# Patient Record
Sex: Male | Born: 2000 | Race: Black or African American | Hispanic: No | Marital: Single | Smoking: Never smoker
Health system: Southern US, Community
[De-identification: ages and names within clinical notes are randomized; demographics above are authoritative.]

## PROBLEM LIST (undated history)

## (undated) HISTORY — PX: APPENDECTOMY: SHX54

---
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
## 2018-06-18 ENCOUNTER — Emergency Department (HOSPITAL_COMMUNITY): Payer: BLUE CROSS/BLUE SHIELD

## 2018-06-18 ENCOUNTER — Emergency Department (HOSPITAL_COMMUNITY): Payer: BLUE CROSS/BLUE SHIELD | Admitting: Certified Registered Nurse Anesthetist

## 2018-06-18 ENCOUNTER — Encounter (HOSPITAL_COMMUNITY)
Admission: EM | Disposition: A | Discharge status: Home / Self Care | Payer: Self-pay | Source: Home / Self Care | Attending: Emergency Medicine

## 2018-06-18 ENCOUNTER — Observation Stay (HOSPITAL_COMMUNITY)
Admission: EM | Admit: 2018-06-18 | Discharge: 2018-06-19 | Disposition: A | Discharge status: Home / Self Care | Payer: BLUE CROSS/BLUE SHIELD | Attending: Surgery | Admitting: Surgery

## 2018-06-18 ENCOUNTER — Encounter (HOSPITAL_COMMUNITY): Payer: Self-pay | Admitting: Emergency Medicine

## 2018-06-18 DIAGNOSIS — K3580 Unspecified acute appendicitis: Secondary | ICD-10-CM | POA: Diagnosis present

## 2018-06-18 HISTORY — PX: LAPAROSCOPIC APPENDECTOMY: SHX408

## 2018-06-18 LAB — COMPREHENSIVE METABOLIC PANEL
ALT: 16 U/L (ref 0–44)
AST: 30 U/L (ref 15–41)
Albumin: 4 g/dL (ref 3.5–5.0)
Alkaline Phosphatase: 56 U/L (ref 38–126)
Anion gap: 8 (ref 5–15)
BUN: 17 mg/dL (ref 6–20)
CO2: 31 mmol/L (ref 22–32)
Calcium: 9.5 mg/dL (ref 8.9–10.3)
Chloride: 100 mmol/L (ref 98–111)
Creatinine, Ser: 1.06 mg/dL (ref 0.61–1.24)
GFR calc Af Amer: >60 mL/min (ref >60)
GFR calc non Af Amer: >60 mL/min (ref >60)
Glucose, Bld: 63 mg/dL — ABNORMAL LOW (ref 70–99)
Potassium: 4.3 mmol/L (ref 3.5–5.1)
Sodium: 139 mmol/L (ref 135–145)
Total Bilirubin: 1.1 mg/dL (ref 0.3–1.2)
Total Protein: 7.5 g/dL (ref 6.5–8.1)

## 2018-06-18 LAB — URINALYSIS, ROUTINE W REFLEX MICROSCOPIC
Bilirubin Urine: NEGATIVE
Glucose, UA: NEGATIVE mg/dL
Hgb urine dipstick: NEGATIVE
Ketones, ur: NEGATIVE mg/dL
Leukocytes, UA: NEGATIVE
Nitrite: NEGATIVE
Protein, ur: NEGATIVE mg/dL
Specific Gravity, Urine: 1.03 (ref 1.005–1.030)
pH: 6 (ref 5.0–8.0)

## 2018-06-18 LAB — CBC
HCT: 45 % (ref 39.0–52.0)
Hemoglobin: 14.9 g/dL (ref 13.0–17.0)
MCH: 28.3 pg (ref 26.0–34.0)
MCHC: 33.1 g/dL (ref 30.0–36.0)
MCV: 85.4 fL (ref 78.0–100.0)
Platelets: 179 10*3/uL (ref 150–400)
RBC: 5.27 MIL/uL (ref 4.22–5.81)
RDW: 11.6 % (ref 11.5–15.5)
WBC: 6.8 10*3/uL (ref 4.0–10.5)

## 2018-06-18 LAB — LIPASE, BLOOD: Lipase: 34 U/L (ref 11–51)

## 2018-06-18 SURGERY — APPENDECTOMY, LAPAROSCOPIC
Anesthesia: General | Site: Abdomen

## 2018-06-18 MED ORDER — BUPIVACAINE-EPINEPHRINE 0.25% -1:200000 IJ SOLN
INTRAMUSCULAR | Status: DC | PRN
  Administered 2018-06-18: 7 mL

## 2018-06-18 MED ORDER — SODIUM CHLORIDE 0.9 % IV SOLN
2.0000 g | Freq: Once | INTRAVENOUS | Status: AC
  Administered 2018-06-18: 2 g via INTRAVENOUS
  Filled 2018-06-18: qty 20

## 2018-06-18 MED ORDER — ACETAMINOPHEN 650 MG RE SUPP: 650.0000 mg | Freq: Four times a day (QID) | RECTAL | Status: DC | PRN

## 2018-06-18 MED ORDER — DIPHENHYDRAMINE HCL 25 MG PO CAPS: 25.0000 mg | ORAL_CAPSULE | Freq: Four times a day (QID) | ORAL | Status: DC | PRN

## 2018-06-18 MED ORDER — LACTATED RINGERS IV SOLN
INTRAVENOUS | Status: DC | PRN
  Administered 2018-06-18: 17:00:00 via INTRAVENOUS

## 2018-06-18 MED ORDER — HYDROMORPHONE HCL 1 MG/ML IJ SOLN: 0.2500 mg | INTRAMUSCULAR | Status: DC | PRN

## 2018-06-18 MED ORDER — MORPHINE SULFATE (PF) 4 MG/ML IV SOLN
4.0000 mg | Freq: Once | INTRAVENOUS | Status: AC
  Administered 2018-06-18: 4 mg via INTRAVENOUS
  Filled 2018-06-18: qty 1

## 2018-06-18 MED ORDER — PROPOFOL 10 MG/ML IV BOLUS
INTRAVENOUS | Status: AC
  Filled 2018-06-18: qty 20

## 2018-06-18 MED ORDER — ONDANSETRON HCL 4 MG/2ML IJ SOLN: 4.0000 mg | Freq: Four times a day (QID) | INTRAMUSCULAR | Status: DC | PRN

## 2018-06-18 MED ORDER — IOHEXOL 300 MG/ML  SOLN
100.0000 mL | Freq: Once | INTRAMUSCULAR | Status: AC | PRN
  Administered 2018-06-18: 100 mL via INTRAVENOUS

## 2018-06-18 MED ORDER — KETOROLAC TROMETHAMINE 30 MG/ML IJ SOLN
INTRAMUSCULAR | Status: DC | PRN
  Administered 2018-06-18: 30 mg via INTRAVENOUS

## 2018-06-18 MED ORDER — PROPOFOL 10 MG/ML IV BOLUS
INTRAVENOUS | Status: DC | PRN
  Administered 2018-06-18: 160 mg via INTRAVENOUS

## 2018-06-18 MED ORDER — OXYCODONE HCL 5 MG PO TABS
5.0000 mg | ORAL_TABLET | ORAL | Status: DC | PRN
  Administered 2018-06-18 – 2018-06-19 (×2): 10 mg via ORAL
  Filled 2018-06-18 (×2): qty 2

## 2018-06-18 MED ORDER — 0.9 % SODIUM CHLORIDE (POUR BTL) OPTIME
TOPICAL | Status: DC | PRN
  Administered 2018-06-18: 1000 mL

## 2018-06-18 MED ORDER — SODIUM CHLORIDE 0.9 % IV BOLUS
1000.0000 mL | Freq: Once | INTRAVENOUS | Status: AC
  Administered 2018-06-18: 1000 mL via INTRAVENOUS

## 2018-06-18 MED ORDER — SUGAMMADEX SODIUM 200 MG/2ML IV SOLN
INTRAVENOUS | Status: DC | PRN
  Administered 2018-06-18: 200 mg via INTRAVENOUS

## 2018-06-18 MED ORDER — MORPHINE SULFATE (PF) 2 MG/ML IV SOLN: 2.0000 mg | INTRAVENOUS | Status: DC | PRN

## 2018-06-18 MED ORDER — LIDOCAINE 2% (20 MG/ML) 5 ML SYRINGE
INTRAMUSCULAR | Status: DC | PRN
  Administered 2018-06-18: 100 mg via INTRAVENOUS

## 2018-06-18 MED ORDER — SODIUM CHLORIDE 0.9 % IV BOLUS
1000.0000 mL | Freq: Once | INTRAVENOUS | Status: AC
Start: 2018-06-18 — End: 2018-06-18
  Administered 2018-06-18: 1000 mL via INTRAVENOUS

## 2018-06-18 MED ORDER — ROCURONIUM BROMIDE 10 MG/ML (PF) SYRINGE
PREFILLED_SYRINGE | INTRAVENOUS | Status: DC | PRN
  Administered 2018-06-18: 30 mg via INTRAVENOUS

## 2018-06-18 MED ORDER — FENTANYL CITRATE (PF) 250 MCG/5ML IJ SOLN
INTRAMUSCULAR | Status: AC
  Filled 2018-06-18: qty 5

## 2018-06-18 MED ORDER — ONDANSETRON HCL 4 MG/2ML IJ SOLN: 4.0000 mg | Freq: Once | INTRAMUSCULAR | Status: DC | PRN

## 2018-06-18 MED ORDER — BUPIVACAINE-EPINEPHRINE (PF) 0.25% -1:200000 IJ SOLN
INTRAMUSCULAR | Status: AC
Start: 2018-06-18 — End: ?
  Filled 2018-06-18: qty 30

## 2018-06-18 MED ORDER — METRONIDAZOLE IN NACL 5-0.79 MG/ML-% IV SOLN
500.0000 mg | Freq: Once | INTRAVENOUS | Status: AC
  Administered 2018-06-18: 500 mg via INTRAVENOUS
  Filled 2018-06-18: qty 100

## 2018-06-18 MED ORDER — MIDAZOLAM HCL 2 MG/2ML IJ SOLN
INTRAMUSCULAR | Status: DC | PRN
  Administered 2018-06-18: 2 mg via INTRAVENOUS

## 2018-06-18 MED ORDER — DIPHENHYDRAMINE HCL 50 MG/ML IJ SOLN: 25.0000 mg | Freq: Four times a day (QID) | INTRAMUSCULAR | Status: DC | PRN

## 2018-06-18 MED ORDER — TRAMADOL HCL 50 MG PO TABS
50.0000 mg | ORAL_TABLET | Freq: Four times a day (QID) | ORAL | Status: DC | PRN
Start: 2018-06-18 — End: 2018-06-19
  Administered 2018-06-19: 50 mg via ORAL
  Filled 2018-06-18: qty 1

## 2018-06-18 MED ORDER — MIDAZOLAM HCL 2 MG/2ML IJ SOLN
INTRAMUSCULAR | Status: AC
  Filled 2018-06-18: qty 2

## 2018-06-18 MED ORDER — MEPERIDINE HCL 50 MG/ML IJ SOLN: 6.2500 mg | INTRAMUSCULAR | Status: DC | PRN

## 2018-06-18 MED ORDER — DEXAMETHASONE SODIUM PHOSPHATE 10 MG/ML IJ SOLN
INTRAMUSCULAR | Status: DC | PRN
  Administered 2018-06-18: 10 mg via INTRAVENOUS

## 2018-06-18 MED ORDER — FENTANYL CITRATE (PF) 250 MCG/5ML IJ SOLN
INTRAMUSCULAR | Status: DC | PRN
  Administered 2018-06-18 (×4): 50 ug via INTRAVENOUS

## 2018-06-18 MED ORDER — ACETAMINOPHEN 325 MG PO TABS: 650.0000 mg | ORAL_TABLET | Freq: Four times a day (QID) | ORAL | Status: DC | PRN

## 2018-06-18 MED ORDER — SUCCINYLCHOLINE CHLORIDE 200 MG/10ML IV SOSY
PREFILLED_SYRINGE | INTRAVENOUS | Status: DC | PRN
  Administered 2018-06-18: 100 mg via INTRAVENOUS

## 2018-06-18 MED ORDER — ONDANSETRON 4 MG PO TBDP: 4.0000 mg | ORAL_TABLET | Freq: Four times a day (QID) | ORAL | Status: DC | PRN

## 2018-06-18 MED ORDER — SODIUM CHLORIDE 0.9 % IV SOLN
INTRAVENOUS | Status: DC
  Administered 2018-06-18: 22:00:00 via INTRAVENOUS

## 2018-06-18 SURGICAL SUPPLY — 50 items
APL SKNCLS STERI-STRIP NONHPOA (GAUZE/BANDAGES/DRESSINGS) ×1
APPLIER CLIP ROT 10 11.4 M/L (STAPLE)
APR CLP MED LRG 11.4X10 (STAPLE)
BAG SPEC RTRVL LRG 6X4 10 (ENDOMECHANICALS) ×1
BENZOIN TINCTURE PRP APPL 2/3 (GAUZE/BANDAGES/DRESSINGS) ×3 IMPLANT
BLADE CLIPPER SURG (BLADE) IMPLANT
CANISTER SUCT 3000ML PPV (MISCELLANEOUS) IMPLANT
CHLORAPREP W/TINT 26ML (MISCELLANEOUS) ×3 IMPLANT
CLIP APPLIE ROT 10 11.4 M/L (STAPLE) IMPLANT
CLOSURE WOUND 1/2 X4 (GAUZE/BANDAGES/DRESSINGS) ×1
COVER SURGICAL LIGHT HANDLE (MISCELLANEOUS) ×3 IMPLANT
CUTTER ENDO LINEAR 45M (STAPLE) ×2 IMPLANT
CUTTER FLEX LINEAR 45M (STAPLE) ×3 IMPLANT
DRSG TEGADERM 2-3/8X2-3/4 SM (GAUZE/BANDAGES/DRESSINGS) ×4 IMPLANT
DRSG TEGADERM 4X4.75 (GAUZE/BANDAGES/DRESSINGS) ×3 IMPLANT
ELECT REM PT RETURN 9FT ADLT (ELECTROSURGICAL) ×3
ELECTRODE REM PT RTRN 9FT ADLT (ELECTROSURGICAL) ×1 IMPLANT
ENDOLOOP SUT PDS II  0 18 (SUTURE)
ENDOLOOP SUT PDS II 0 18 (SUTURE) IMPLANT
FILTER SMOKE EVAC LAPAROSHD (FILTER) IMPLANT
GAUZE SPONGE 2X2 8PLY NS (GAUZE/BANDAGES/DRESSINGS) ×2 IMPLANT
GAUZE SPONGE 2X2 8PLY STRL LF (GAUZE/BANDAGES/DRESSINGS) ×1 IMPLANT
GLOVE BIO SURGEON STRL SZ7 (GLOVE) ×3 IMPLANT
GLOVE BIOGEL PI IND STRL 7.5 (GLOVE) ×1 IMPLANT
GLOVE BIOGEL PI INDICATOR 7.5 (GLOVE) ×2
GOWN STRL REUS W/ TWL LRG LVL3 (GOWN DISPOSABLE) ×3 IMPLANT
GOWN STRL REUS W/TWL LRG LVL3 (GOWN DISPOSABLE) ×9
KIT BASIN OR (CUSTOM PROCEDURE TRAY) ×3 IMPLANT
KIT TURNOVER KIT B (KITS) ×3 IMPLANT
NS IRRIG 1000ML POUR BTL (IV SOLUTION) ×3 IMPLANT
PAD ARMBOARD 7.5X6 YLW CONV (MISCELLANEOUS) ×6 IMPLANT
POUCH SPECIMEN RETRIEVAL 10MM (ENDOMECHANICALS) ×3 IMPLANT
RELOAD STAPLE 45 3.5 BLU ETS (ENDOMECHANICALS) ×1 IMPLANT
RELOAD STAPLE TA45 3.5 REG BLU (ENDOMECHANICALS) ×3 IMPLANT
SCISSORS ENDO CVD 5DCS (MISCELLANEOUS) IMPLANT
SET IRRIG TUBING LAPAROSCOPIC (IRRIGATION / IRRIGATOR) IMPLANT
SHEARS HARMONIC ACE PLUS 36CM (ENDOMECHANICALS) ×3 IMPLANT
SLEEVE ENDOPATH XCEL 5M (ENDOMECHANICALS) ×3 IMPLANT
SPECIMEN JAR SMALL (MISCELLANEOUS) ×3 IMPLANT
SPONGE GAUZE 2X2 STER 10/PKG (GAUZE/BANDAGES/DRESSINGS) ×2
STRIP CLOSURE SKIN 1/2X4 (GAUZE/BANDAGES/DRESSINGS) ×2 IMPLANT
SUT MNCRL AB 4-0 PS2 18 (SUTURE) ×3 IMPLANT
TAPE STRIPS DRAPE STRL (GAUZE/BANDAGES/DRESSINGS) ×2 IMPLANT
TOWEL OR 17X24 6PK STRL BLUE (TOWEL DISPOSABLE) ×3 IMPLANT
TOWEL OR 17X26 10 PK STRL BLUE (TOWEL DISPOSABLE) ×3 IMPLANT
TRAY LAPAROSCOPIC MC (CUSTOM PROCEDURE TRAY) ×3 IMPLANT
TROCAR XCEL BLUNT TIP 100MML (ENDOMECHANICALS) ×3 IMPLANT
TROCAR XCEL NON-BLD 5MMX100MML (ENDOMECHANICALS) ×3 IMPLANT
TUBING INSUFFLATION (TUBING) ×3 IMPLANT
WATER STERILE IRR 1000ML POUR (IV SOLUTION) ×3 IMPLANT

## 2018-06-18 NOTE — Anesthesia Postprocedure Evaluation (Signed)
Anesthesia Post Note  Patient: Charles Craig  Procedure(s) Performed: APPENDECTOMY LAPAROSCOPIC (N/A Abdomen)     Patient location during evaluation: PACU Anesthesia Type: General Level of consciousness: awake and alert Pain management: pain level controlled Vital Signs Assessment: post-procedure vital signs reviewed and stable Respiratory status: spontaneous breathing, nonlabored ventilation, respiratory function stable and patient connected to nasal cannula oxygen Cardiovascular status: blood pressure returned to baseline and stable Postop Assessment: no apparent nausea or vomiting Anesthetic complications: no    Last Vitals:  Vitals:   06/18/18 1830 06/18/18 1845  BP: 118/66 125/79  Pulse: 86 (!) 58  Resp: 19 14  Temp:    SpO2: 99% 100%    Last Pain:  Vitals:   06/18/18 1900  TempSrc:   PainSc: 0-No pain                 Jarica Plass DAVID

## 2018-06-18 NOTE — Anesthesia Procedure Notes (Signed)
Procedure Name: Intubation Date/Time: 06/18/2018 5:19 PM Performed by: Clearnce Sorrel, CRNA Pre-anesthesia Checklist: Patient identified, Emergency Drugs available, Suction available, Patient being monitored and Timeout performed Patient Re-evaluated:Patient Re-evaluated prior to induction Oxygen Delivery Method: Circle system utilized Preoxygenation: Pre-oxygenation with 100% oxygen Induction Type: IV induction, Cricoid Pressure applied and Rapid sequence Laryngoscope Size: Mac and 3 Grade View: Grade I Tube type: Oral Tube size: 7.5 mm Number of attempts: 1 Airway Equipment and Method: Stylet Placement Confirmation: ETT inserted through vocal cords under direct vision,  positive ETCO2 and breath sounds checked- equal and bilateral Secured at: 22 cm Tube secured with: Tape Dental Injury: Teeth and Oropharynx as per pre-operative assessment

## 2018-06-18 NOTE — Progress Notes (Signed)
Pt tolerating clears, will try full liquid

## 2018-06-18 NOTE — H&P (Signed)
Charles Craig is an 17 y.o. male.   Chief Complaint: RLQ pain HPI: This is an 17 yo male in good health who presents with several days of RLQ abdominal pain, mild anorexia.  No nausea or vomiting.  Pain has improved slightly.  He came to the ED for evaluation and was found to have early appendicitis.  History reviewed. No pertinent past medical history.  History reviewed. No pertinent surgical history.  No family history on file. Social History:  reports that he is a non-smoker but has been exposed to tobacco smoke. He has never used smokeless tobacco. He reports that he has current or past drug history. Drug: Marijuana. He reports that he does not drink alcohol.  Allergies: No Known Allergies  Prior to Admission medications   Medication Sig Start Date End Date Taking? Authorizing Provider  calcium carbonate (OS-CAL) 1250 (500 Ca) MG chewable tablet Chew 1 tablet by mouth daily.   Yes [provider]  ibuprofen (ADVIL,MOTRIN) 200 MG tablet Take 400 mg by mouth every 6 (six) hours as needed for headache or moderate pain.   Yes [provider]  ibuprofen (ADVIL,MOTRIN) 600 MG tablet Take 1 tablet (600 mg total) by mouth every 6 (six) hours as needed for mild pain or moderate pain. Patient not taking: Reported on 06/18/2018 03/11/17   Benjamine Sprague, NP     Results for orders placed or performed during the hospital encounter of 06/18/18 (from the past 48 hour(s))  Lipase, blood     Status: None   Collection Time: 06/18/18 11:46 AM  Result Value Ref Range   Lipase 34 11 - 51 U/L    Comment: Performed at [facility], [place] [address] [place], [place] [address]  Comprehensive metabolic panel     Status: Abnormal   Collection Time: 06/18/18 11:46 AM  Result Value Ref Range   Sodium 139 135 - 145 mmol/L   Potassium 4.3 3.5 - 5.1 mmol/L   Chloride 100 98 - 111 mmol/L   CO2 31 22 - 32 mmol/L   Glucose, Bld 63 (L) 70 - 99 mg/dL   BUN 17 6 - 20 mg/dL    Creatinine, Ser 1.06 0.61 - 1.24 mg/dL   Calcium 9.5 8.9 - 10.3 mg/dL   Total Protein 7.5 6.5 - 8.1 g/dL   Albumin 4.0 3.5 - 5.0 g/dL   AST 30 15 - 41 U/L   ALT 16 0 - 44 U/L   Alkaline Phosphatase 56 38 - 126 U/L   Total Bilirubin 1.1 0.3 - 1.2 mg/dL   GFR calc non Af Amer >60 >60 mL/min   GFR calc Af Amer >60 >60 mL/min    Comment: (NOTE) The eGFR has been calculated using the CKD EPI equation. This calculation has not been validated in all clinical situations. eGFR's persistently <60 mL/min signify possible Chronic Kidney Disease.    Anion gap 8 5 - 15    Comment: Performed at [facility] [address] [place] [address]  CBC     Status: None   Collection Time: 06/18/18 11:46 AM  Result Value Ref Range   WBC 6.8 4.0 - 10.5 K/uL   RBC 5.27 4.22 - 5.81 MIL/uL   Hemoglobin 14.9 13.0 - 17.0 g/dL   HCT 45.0 39.0 - 52.0 %   MCV 85.4 78.0 - 100.0 fL   MCH 28.3 26.0 - 34.0 pg   MCHC 33.1 30.0 - 36.0 g/dL   RDW 11.6  11.5 - 15.5 %   Platelets 179 150 - 400 K/uL    Comment: Performed at [facility], [place] [address], [place], [place] [address]  Urinalysis, Routine w reflex microscopic     Status: None   Collection Time: 06/18/18 11:46 AM  Result Value Ref Range   Color, Urine YELLOW YELLOW   APPearance CLEAR CLEAR   Specific Gravity, Urine 1.030 1.005 - 1.030   pH 6.0 5.0 - 8.0   Glucose, UA NEGATIVE NEGATIVE mg/dL   Hgb urine dipstick NEGATIVE NEGATIVE   Bilirubin Urine NEGATIVE NEGATIVE   Ketones, ur NEGATIVE NEGATIVE mg/dL   Protein, ur NEGATIVE NEGATIVE mg/dL   Nitrite NEGATIVE NEGATIVE   Leukocytes, UA NEGATIVE NEGATIVE    Comment: Performed at [facility] [address], [place], [place] [address]   Ct Abdomen Pelvis W Contrast  Result Date: 06/18/2018 CLINICAL DATA:  Abdominal pain. EXAM: CT ABDOMEN AND PELVIS WITH CONTRAST TECHNIQUE: Multidetector CT imaging of the abdomen and pelvis was performed using the standard protocol following  bolus administration of intravenous contrast. CONTRAST:  144m OMNIPAQUE IOHEXOL 300 MG/ML  SOLN COMPARISON:  None. FINDINGS: Lower chest: Clear lung bases.  Heart normal in size. Hepatobiliary: Liver is normal in size and attenuation. There is low attenuation surrounding the portal veins consist with periportal edema, of unclear etiology. No liver mass or focal lesion. No bile duct dilation. Gallbladder is mostly collapsed but otherwise unremarkable. Pancreas: Choose 1 Spleen: Normal in size without focal abnormality. Adrenals/Urinary Tract: No adrenal masses. Kidneys normal size, orientation and position with symmetric enhancement. No renal masses, stones or hydronephrosis. Visualized portions of the ureters are normal course and in caliber. Bladder is unremarkable. Stomach/Bowel: In the right pelvis there is a curled tubular structure measuring 9 mm in greatest diameter containing too small stones. This appears to be blind-ending and is consistent with the appendix. There is no definite associated inflammation, although this assessment is limited by the limited adjacent fat. Stomach is unremarkable. Bowel is normal caliber with no wall thickening inflammation. Vascular/Lymphatic: No significant vascular findings are present. No enlarged abdominal or pelvic lymph nodes. Reproductive: Unremarkable Other: No abdominal wall hernia or abnormality. No abdominopelvic ascites. Musculoskeletal: No acute or significant osseous findings. IMPRESSION: 1. Findings are consistent with early acute appendicitis. The appendix lies in the right pelvis, dilated to a maximum of 9 mm, containing 2 small appendicoliths. There is no apparent adjacent inflammation, however. No evidence of appendiceal rupture or of a periappendiceal abscess. 2. Periportal edema the liver unclear etiology. This can be seen with hepatic inflammation. 3. No other abnormalities. Electronically Signed   By: DLajean ManesM.D.   On: 06/18/2018 15:17     Review of Systems  Constitutional: Negative for weight loss.  HENT: Negative for ear discharge, ear pain, hearing loss and tinnitus.   Eyes: Negative for blurred vision, double vision, photophobia and pain.  Respiratory: Negative for cough, sputum production and shortness of breath.   Cardiovascular: Negative for chest pain.  Gastrointestinal: Positive for abdominal pain. Negative for nausea and vomiting.  Genitourinary: Negative for dysuria, flank pain, frequency and urgency.  Musculoskeletal: Negative for back pain, falls, joint pain, myalgias and neck pain.  Neurological: Negative for dizziness, tingling, sensory change, focal weakness, loss of consciousness and headaches.  Endo/Heme/Allergies: Does not bruise/bleed easily.  Psychiatric/Behavioral: Negative for depression, memory loss and substance abuse. The patient is not nervous/anxious.     Blood pressure (!) 135/95, pulse 84, temperature 98.5  F (36.9 C), temperature source Oral, resp. rate 14, height 5' 7"  (1.702 m), weight 61.2 kg, SpO2 99 %. Physical Exam  WDWN in NAD Eyes:  Pupils equal, round; sclera anicteric HENT:  Oral mucosa moist; good dentition  Neck:  No masses palpated, no thyromegaly Lungs:  CTA bilaterally; normal respiratory effort CV:  Regular rate and rhythm; no murmurs; extremities well-perfused with no edema Abd:  +bowel sounds, soft, tender in Right pelvis; no palpable masses, no palpable organomegaly; no palpable hernias Skin:  Warm, dry; no sign of jaundice Psychiatric - alert and oriented x 4; calm mood and affect  Assessment/Plan Acute appendicitis  Laparoscopic appendectomy.  The surgical procedure has been discussed with the patient.  Potential risks, benefits, alternative treatments, and expected outcomes have been explained.  All of the patient's questions at this time have been answered.  The likelihood of reaching the patient's treatment goal is good.  The patient understand the proposed  surgical procedure and wishes to proceed.   Maia Petties, MD 06/18/2018, 4:20 PM

## 2018-06-18 NOTE — ED Notes (Signed)
Patient transported to CT 

## 2018-06-18 NOTE — Op Note (Signed)
Appendectomy, Lap, Procedure Note  Indications: The patient presented with a history of right-sided abdominal pain. A CT scan revealed findings consistent with acute appendicitis.  Pre-operative Diagnosis: Acute appendicitis without mention of peritonitis  Post-operative Diagnosis: Same  Surgeon: Wynona LunaMatthew K Radames Craig   Assistants: none  Anesthesia: General endotracheal anesthesia  ASA Class: 1E  Procedure Details  The patient was seen again in the Holding Room. The risks, benefits, complications, treatment options, and expected outcomes were discussed with the patient and/or family. The possibilities of reaction to medication, perforation of viscus, bleeding, recurrent infection, finding a normal appendix, the need for additional procedures, failure to diagnose a condition, and creating a complication requiring transfusion or operation were discussed. There was concurrence with the proposed plan and informed consent was obtained. The site of surgery was properly noted. The patient was taken to Operating Room, identified as Charles Craig and the procedure verified as Appendectomy. A Time Out was held and the above information confirmed.  The patient was placed in the supine position and general anesthesia was induced.  The abdomen was prepped and draped in a sterile fashion. A one centimeter supraumbilical incision was made.  Dissection was carried down to the fascia bluntly.  The fascia was incised vertically.  We entered the peritoneal cavity bluntly.  A pursestring suture was passed around the incision with a 0 Vicryl.  The Hasson cannula was introduced into the abdomen and the tails of the suture were used to hold the Hasson in place.   The pneumoperitoneum was then established maintaining a maximum pressure of 15 mmHg.  Additional 5 mm cannulas then placed in the left lower quadrant of the abdomen and the right upper quadrant under direct visualization. A careful evaluation of the entire  abdomen was carried out. The patient was placed in Trendelenburg and left lateral decubitus position.  The scope was moved to the right upper quadrant port site. The cecum was mobilized medially.  The appendix was inflamed but there was no sign of perforation. The appendix was carefully dissected. The appendix was skeletonized with the harmonic scalpel.   The appendix was divided at its base using an endo-GIA stapler. Minimal appendiceal stump was left in place. There was no evidence of bleeding, leakage, or complication after division of the appendix. Irrigation was also performed and irrigate suctioned from the abdomen as well.  The umbilical port site was closed with the purse string suture. There was no residual palpable fascial defect.  The trocar site skin wounds were closed with 4-0 Monocryl.  Instrument, sponge, and needle counts were correct at the conclusion of the case.   Findings: The appendix was found to be inflamed. There were not signs of necrosis.  There was not perforation. There was not abscess formation.  Estimated Blood Loss:  Minimal         Drains: none         Specimens: appendix          Complications:  None; patient tolerated the procedure well.         Disposition: PACU - hemodynamically stable.         Condition: stable  Charles ArmsMatthew K. Corliss Skainssuei, MD, [facility]  General/ Trauma Surgery  06/18/2018 5:58 PM

## 2018-06-18 NOTE — Anesthesia Preprocedure Evaluation (Signed)
Anesthesia Evaluation  Patient identified by MRN, date of birth, ID band Patient awake    Reviewed: Allergy & Precautions, NPO status , Patient's Chart, lab work & pertinent test results  Airway Mallampati: I  TM Distance: >3 FB Neck ROM: Full    Dental   Pulmonary    Pulmonary exam normal        Cardiovascular Normal cardiovascular exam     Neuro/Psych    GI/Hepatic   Endo/Other    Renal/GU      Musculoskeletal   Abdominal   Peds  Hematology   Anesthesia Other Findings   Reproductive/Obstetrics                             Anesthesia Physical Anesthesia Plan  ASA: II and emergent  Anesthesia Plan: General   Post-op Pain Management:    Induction: Intravenous, Rapid sequence and Cricoid pressure planned  PONV Risk Score and Plan: 2 and Ondansetron and Midazolam  Airway Management Planned: Oral ETT  Additional Equipment:   Intra-op Plan:   Post-operative Plan: Extubation in OR  Informed Consent: I have reviewed the patients History and Physical, chart, labs and discussed the procedure including the risks, benefits and alternatives for the proposed anesthesia with the patient or authorized representative who has indicated his/her understanding and acceptance.       Plan Discussed with: CRNA and Surgeon  Anesthesia Plan Comments:         Anesthesia Quick Evaluation  

## 2018-06-18 NOTE — Anesthesia Postprocedure Evaluation (Signed)
Anesthesia Post Note  Patient: Charles Craig  Procedure(s) Performed: APPENDECTOMY LAPAROSCOPIC (N/A )     Patient location during evaluation: SICU Anesthesia Type: General Level of consciousness: sedated Pain management: pain level controlled Vital Signs Assessment: post-procedure vital signs reviewed and stable Respiratory status: patient remains intubated per anesthesia plan Cardiovascular status: stable Postop Assessment: no apparent nausea or vomiting Anesthetic complications: no    Last Vitals:  Vitals:   06/18/18 1245 06/18/18 1538  BP: 119/68 (!) 135/95  Pulse: 66 84  Resp:  14  Temp:    SpO2: 100% 99%    Last Pain:  Vitals:   06/18/18 1540  TempSrc:   PainSc: 7                  Muriah Harsha DAVID

## 2018-06-18 NOTE — ED Provider Notes (Signed)
[facility] EMERGENCY DEPARTMENT Provider Note   CSN: [id number] Arrival date & time: 06/18/18  1139     History   Chief Complaint Chief Complaint  Patient presents with  . Abdominal Pain    HPI Charles Craig is a 17 y.o. male with no significant past medical history who presents for evaluation of intermittent right lower quadrant abdominal pain that began a proximally 4 days ago.  Patient states that when he first had the pain, it was very intense in the right lower quadrant.  He states that it resolved on its own.  He states that since then, the pain has come and gone.  He states that the pain is worse after eating.  He describes it as a sharp pain in the right lower quadrant.  He states that he has not had any vomiting.  He had one episode of diarrhea yesterday.  No blood in the stool.  No diarrhea since then.  Patient states he has not had any fever.  He states he has not taken any medication for the pain.  Currently denies any pain at this time.  Patient denies any fevers, chest pain, difficulty breathing, urinary complaints.  The history is provided by the patient.    History reviewed. No pertinent past medical history.  There are no active problems to display for this patient.   History reviewed. No pertinent surgical history.      Home Medications    Prior to Admission medications   Medication Sig Start Date End Date Taking? Authorizing Provider  calcium carbonate (OS-CAL) 1250 (500 Ca) MG chewable tablet Chew 1 tablet by mouth daily.   Yes [provider]  ibuprofen (ADVIL,MOTRIN) 200 MG tablet Take 400 mg by mouth every 6 (six) hours as needed for headache or moderate pain.   Yes [provider]  ibuprofen (ADVIL,MOTRIN) 600 MG tablet Take 1 tablet (600 mg total) by mouth every 6 (six) hours as needed for mild pain or moderate pain. Patient not taking: Reported on 06/18/2018 03/11/17   Ronnell Freshwater, NP    Family  History No family history on file.  Social History Social History   Tobacco Use  . Smoking status: Passive Smoke Exposure - Never Smoker  . Smokeless tobacco: Never Used  Substance Use Topics  . Alcohol use: No  . Drug use: Yes    Types: Marijuana    Comment: only 3 times     Allergies   Patient has no known allergies.   Review of Systems Review of Systems  Constitutional: Negative for fever.  Respiratory: Negative for cough and shortness of breath.   Cardiovascular: Negative for chest pain.  Gastrointestinal: Positive for abdominal pain and diarrhea. Negative for nausea and vomiting.  Genitourinary: Negative for dysuria and hematuria.  Neurological: Negative for headaches.  All other systems reviewed and are negative.    Physical Exam Updated Vital Signs BP (!) 135/95   Pulse 84   Temp 98.5 F (36.9 C) (Oral)   Resp 14   Ht 5\' 7"  (1.702 m)   Wt 61.2 kg   SpO2 99%   BMI 21.14 kg/m   Physical Exam  Constitutional: He appears well-developed and well-nourished.  HENT:  Head: Normocephalic and atraumatic.  Eyes: Conjunctivae and EOM are normal. Right eye exhibits no discharge. Left eye exhibits no discharge. No scleral icterus.  Pulmonary/Chest: Effort normal.  Abdominal: Bowel sounds are normal. There is tenderness. There is tenderness at McBurney's point. There is  no rigidity, no guarding and no CVA tenderness. Hernia confirmed negative in the right inguinal area and confirmed negative in the left inguinal area.  Abdomen is soft, nondistended.  He does exhibit tenderness at McBurney's point.  No rigidity, guarding.  No CVA tenderness bilaterally.  Genitourinary: Testes normal and penis normal. Right testis shows no swelling and no tenderness. Left testis shows no swelling and no tenderness. Circumcised.  Genitourinary Comments: The exam was performed with a chaperone present. Normal male genitalia. No evidence of rash, ulcers or lesions.  No tenderness, swelling,  erythema noted to bilateral testicles.  No evidence of hernia.  Neurological: He is alert.  Skin: Skin is warm and dry.  Psychiatric: He has a normal mood and affect. His speech is normal and behavior is normal.  Nursing note and vitals reviewed.    ED Treatments / Results  Labs (all labs ordered are listed, but only abnormal results are displayed) Labs Reviewed  COMPREHENSIVE METABOLIC PANEL - Abnormal; Notable for the following components:      Result Value   Glucose, Bld 63 (*)    All other components within normal limits  LIPASE, BLOOD  CBC  URINALYSIS, ROUTINE W REFLEX MICROSCOPIC    EKG None  Radiology Ct Abdomen Pelvis W Contrast  Result Date: 06/18/2018 CLINICAL DATA:  Abdominal pain. EXAM: CT ABDOMEN AND PELVIS WITH CONTRAST TECHNIQUE: Multidetector CT imaging of the abdomen and pelvis was performed using the standard protocol following bolus administration of intravenous contrast. CONTRAST:  100mL OMNIPAQUE IOHEXOL 300 MG/ML  SOLN COMPARISON:  None. FINDINGS: Lower chest: Clear lung bases.  Heart normal in size. Hepatobiliary: Liver is normal in size and attenuation. There is low attenuation surrounding the portal veins consist with periportal edema, of unclear etiology. No liver mass or focal lesion. No bile duct dilation. Gallbladder is mostly collapsed but otherwise unremarkable. Pancreas: Choose 1 Spleen: Normal in size without focal abnormality. Adrenals/Urinary Tract: No adrenal masses. Kidneys normal size, orientation and position with symmetric enhancement. No renal masses, stones or hydronephrosis. Visualized portions of the ureters are normal course and in caliber. Bladder is unremarkable. Stomach/Bowel: In the right pelvis there is a curled tubular structure measuring 9 mm in greatest diameter containing too small stones. This appears to be blind-ending and is consistent with the appendix. There is no definite associated inflammation, although this assessment is  limited by the limited adjacent fat. Stomach is unremarkable. Bowel is normal caliber with no wall thickening inflammation. Vascular/Lymphatic: No significant vascular findings are present. No enlarged abdominal or pelvic lymph nodes. Reproductive: Unremarkable Other: No abdominal wall hernia or abnormality. No abdominopelvic ascites. Musculoskeletal: No acute or significant osseous findings. IMPRESSION: 1. Findings are consistent with early acute appendicitis. The appendix lies in the right pelvis, dilated to a maximum of 9 mm, containing 2 small appendicoliths. There is no apparent adjacent inflammation, however. No evidence of appendiceal rupture or of a periappendiceal abscess. 2. Periportal edema the liver unclear etiology. This can be seen with hepatic inflammation. 3. No other abnormalities. Electronically Signed   By: Amie Portlandavid  Ormond M.D.   On: 06/18/2018 15:17    Procedures Procedures (including critical care time)  Medications Ordered in ED Medications  cefTRIAXone (ROCEPHIN) 2 g in sodium chloride 0.9 % 100 mL IVPB (0 g Intravenous Stopped 06/18/18 1614)    And  metroNIDAZOLE (FLAGYL) IVPB 500 mg (500 mg Intravenous New Bag/Given 06/18/18 1615)  sodium chloride 0.9 % bolus 1,000 mL (1,000 mLs Intravenous New Bag/Given 06/18/18 1547)  sodium chloride 0.9 % bolus 1,000 mL (0 mLs Intravenous Stopped 06/18/18 1529)  iohexol (OMNIPAQUE) 300 MG/ML solution 100 mL (100 mLs Intravenous Contrast Given 06/18/18 1432)  morphine 4 MG/ML injection 4 mg (4 mg Intravenous Given 06/18/18 1544)     Initial Impression / Assessment and Plan / ED Course  I have reviewed the triage vital signs and the nursing notes.  Pertinent labs & imaging results that were available during my care of the patient were reviewed by me and considered in my medical decision making (see chart for details).     17 year old male who presents for evaluation of right lower quadrant abdominal pain that began 4 days ago.  Reports pain  is been intermittent.  Worse after eating.  No fevers, vomiting.  One episode of diarrhea yesterday but none since. Patient is afebrile, non-toxic appearing, sitting comfortably on examination table. Vital signs reviewed and stable.  On exam, she exhibits tenderness noted to the right lower quadrant, specifically at McBurney's point tenderness.  No other tenderness.  No CVA tenderness.  Low suspicion for appendicitis given history/physical exam but given concerns of McBurney's point tenderness, will plan for evaluation with imaging.  Also consider PUD versus viral gastroenteritis.  Labs ordered at triage.    CMP unremarkable.  Lipase is normal.  UA without any acute infectious etiology.  CBC without any leukocytosis, anemia.  CT abdomen pelvis is consistent with early acute appendicitis.  No evidence of rupture, abscess.  Discussed results with since.  We will plan to consult general surgery.  Discussed patient with Dr. Corliss Skains (Gen Surg). Will see patient.   Final Clinical Impressions(s) / ED Diagnoses   Final diagnoses:  Acute appendicitis, unspecified acute appendicitis type    ED Discharge Orders    None       Maxwell Caul, PA-C 06/18/18 1617    Charlynne Pander, MD 06/20/18 [phone]

## 2018-06-18 NOTE — Transfer of Care (Signed)
Immediate Anesthesia Transfer of Care Note  Patient: Charles Craig  Procedure(s) Performed: APPENDECTOMY LAPAROSCOPIC (N/A Abdomen)  Patient Location: PACU  Anesthesia Type:General  Level of Consciousness: awake, alert  and oriented  Airway & Oxygen Therapy: Patient Spontanous Breathing and Patient connected to nasal cannula oxygen  Post-op Assessment: Report given to RN and Post -op Vital signs reviewed and stable  Post vital signs: Reviewed and stable  Last Vitals:  Vitals Value Taken Time  BP    Temp    Pulse 78 06/18/2018  6:09 PM  Resp 16 06/18/2018  6:09 PM  SpO2 100 % 06/18/2018  6:09 PM  Vitals shown include unvalidated device data.  Last Pain:  Vitals:   06/18/18 1540  TempSrc:   PainSc: 7          Complications: No apparent anesthesia complications

## 2018-06-18 NOTE — ED Triage Notes (Signed)
Pt reports RLQ pain since Thursday, patient states pain gets worse when he eats. Denies vomiting, endorses diarrhea.

## 2018-06-19 ENCOUNTER — Encounter (HOSPITAL_COMMUNITY): Payer: Self-pay | Admitting: Surgery

## 2018-06-19 ENCOUNTER — Other Ambulatory Visit: Payer: Self-pay

## 2018-06-19 ENCOUNTER — Encounter (HOSPITAL_COMMUNITY): Payer: Self-pay

## 2018-06-19 DIAGNOSIS — K3580 Unspecified acute appendicitis: Secondary | ICD-10-CM | POA: Diagnosis not present

## 2018-06-19 MED ORDER — OXYCODONE HCL 5 MG PO TABS: 5.0000 mg | ORAL_TABLET | Freq: Four times a day (QID) | ORAL | 0 refills | Status: DC | PRN

## 2018-06-19 NOTE — Discharge Instructions (Signed)
Please arrive at least 30 min before your appointment to complete your check in paperwork.  If you are unable to arrive 30 min prior to your appointment time we may have to cancel or reschedule you. ° °LAPAROSCOPIC SURGERY: POST OP INSTRUCTIONS  °1. DIET: Follow a light bland diet the first 24 hours after arrival home, such as soup, liquids, crackers, etc. Be sure to include lots of fluids daily. Avoid fast food or heavy meals as your are more likely to get nauseated. Eat a low fat the next few days after surgery.  °2. Take your usually prescribed home medications unless otherwise directed. °3. PAIN CONTROL:  °1. Pain is best controlled by a usual combination of three different methods TOGETHER:  °1. Ice/Heat °2. Over the counter pain medication °3. Prescription pain medication °2. Most patients will experience some swelling and bruising around the incisions. Ice packs or heating pads (30-60 minutes up to 6 times a day) will help. Use ice for the first few days to help decrease swelling and bruising, then switch to heat to help relax tight/sore spots and speed recovery. Some people prefer to use ice alone, heat alone, alternating between ice & heat. Experiment to what works for you. Swelling and bruising can take several weeks to resolve.  °3. It is helpful to take an over-the-counter pain medication regularly for the first few weeks. Choose one of the following that works best for you:  °1. Naproxen (Aleve, etc) Two 220mg tabs twice a day °2. Ibuprofen (Advil, etc) Three 200mg tabs four times a day (every meal & bedtime) °3. Acetaminophen (Tylenol, etc) 500-650mg four times a day (every meal & bedtime) °4. A prescription for pain medication (such as oxycodone, hydrocodone, etc) should be given to you upon discharge. Take your pain medication as prescribed.  °1. If you are having problems/concerns with the prescription medicine (does not control pain, nausea, vomiting, rash, itching, etc), please call us [phone] to see if we need to switch you to a different pain medicine that will work better for you and/or control your side effect better. °2. If you need a refill on your pain medication, please contact your pharmacy. They will contact our office to request authorization. Prescriptions will not be filled after 5 pm or on week-ends. °4. Avoid getting constipated. Between the surgery and the pain medications, it is common to experience some constipation. Increasing fluid intake and taking a fiber supplement (such as Metamucil, Citrucel, FiberCon, MiraLax, etc) 1-2 times a day regularly will usually help prevent this problem from occurring. A mild laxative (prune juice, Milk of Magnesia, MiraLax, etc) should be taken according to package directions if there are no bowel movements after 48 hours.  °5. Watch out for diarrhea. If you have many loose bowel movements, simplify your diet to bland foods & liquids for a few days. Stop any stool softeners and decrease your fiber supplement. Switching to mild anti-diarrheal medications (Kayopectate, Pepto Bismol) can help. If this worsens or does not improve, please call us. °6. Wash / shower every day. You may shower over the dressings as they are waterproof. Continue to shower over incision(s) after the dressing is off. °7. Remove your waterproof bandages 5 days after surgery. You may leave the incision open to air. You may replace a dressing/Band-Aid to cover the incision for comfort if you wish.  °8. ACTIVITIES as tolerated:  °1. You may resume regular (light) daily activities beginning the next day--such as daily self-care, walking, climbing stairs--gradually   increasing activities as tolerated. If you can walk 30 minutes without difficulty, it is safe to try more intense activity such as jogging, treadmill, bicycling, low-impact aerobics, swimming, etc. °2. Save the most intensive and strenuous activity for last such as sit-ups, heavy lifting, contact sports, etc Refrain  from any heavy lifting or straining until you are off narcotics for pain control.  °3. DO NOT PUSH THROUGH PAIN. Let pain be your guide: If it hurts to do something, don't do it. Pain is your body warning you to avoid that activity for another week until the pain goes down. °4. You may drive when you are no longer taking prescription pain medication, you can comfortably wear a seatbelt, and you can safely maneuver your car and apply brakes. °5. You may have sexual intercourse when it is comfortable.  °9. FOLLOW UP in our office  °1. Please call [facility] at [phone] to set up an appointment to see your surgeon in the office for a follow-up appointment approximately 2-3 weeks after your surgery. °2. Make sure that you call for this appointment the day you arrive home to insure a convenient appointment time. °     10. IF YOU HAVE DISABILITY OR FAMILY LEAVE FORMS, BRING THEM TO THE               OFFICE FOR PROCESSING.  ° °WHEN TO CALL US [phone]:  °1. Poor pain control °2. Reactions / problems with new medications (rash/itching, nausea, etc)  °3. Fever over 101.5 F (38.5 C) °4. Inability to urinate °5. Nausea and/or vomiting °6. Worsening swelling or bruising °7. Continued bleeding from incision. °8. Increased pain, redness, or drainage from the incision ° °The clinic staff is available to answer your questions during regular business hours (8:30am-5pm). Please don’t hesitate to call and ask to speak to one of our nurses for clinical concerns.  °If you have a medical emergency, go to the nearest emergency room or call 911.  °A surgeon from [facility] is always on call at the hospitals  ° °[facility]  °[address], [address], [place], [place] [address] ?  °MAIN: [phone] ? TOLL FREE: [phone] ?  °FAX [phone]  °[email or web address] ° °

## 2018-06-19 NOTE — Progress Notes (Signed)
Pt is discharged to go home.  Discharge instructions and prescription given. 

## 2018-06-19 NOTE — Discharge Summary (Signed)
[facility]/Trauma Discharge Summary   Patient ID: Charles Craig MRN: [id number] DOB/AGE: 19/01/2001 16 y.o.  Admit date: 06/18/2018 Discharge date: 06/19/2018  Admitting Diagnosis: appendicitis  Discharge Diagnosis Patient Active Problem List   Diagnosis Date Noted  . Acute appendicitis 06/18/2018    Consultants none  Imaging: Ct Abdomen Pelvis W Contrast  Result Date: 06/18/2018 CLINICAL DATA:  Abdominal pain. EXAM: CT ABDOMEN AND PELVIS WITH CONTRAST TECHNIQUE: Multidetector CT imaging of the abdomen and pelvis was performed using the standard protocol following bolus administration of intravenous contrast. CONTRAST:  100mL OMNIPAQUE IOHEXOL 300 MG/ML  SOLN COMPARISON:  None. FINDINGS: Lower chest: Clear lung bases.  Heart normal in size. Hepatobiliary: Liver is normal in size and attenuation. There is low attenuation surrounding the portal veins consist with periportal edema, of unclear etiology. No liver mass or focal lesion. No bile duct dilation. Gallbladder is mostly collapsed but otherwise unremarkable. Pancreas: Choose 1 Spleen: Normal in size without focal abnormality. Adrenals/Urinary Tract: No adrenal masses. Kidneys normal size, orientation and position with symmetric enhancement. No renal masses, stones or hydronephrosis. Visualized portions of the ureters are normal course and in caliber. Bladder is unremarkable. Stomach/Bowel: In the right pelvis there is a curled tubular structure measuring 9 mm in greatest diameter containing too small stones. This appears to be blind-ending and is consistent with the appendix. There is no definite associated inflammation, although this assessment is limited by the limited adjacent fat. Stomach is unremarkable. Bowel is normal caliber with no wall thickening inflammation. Vascular/Lymphatic: No significant vascular findings are present. No enlarged abdominal or pelvic lymph nodes. Reproductive: Unremarkable Other: No  abdominal wall hernia or abnormality. No abdominopelvic ascites. Musculoskeletal: No acute or significant osseous findings. IMPRESSION: 1. Findings are consistent with early acute appendicitis. The appendix lies in the right pelvis, dilated to a maximum of 9 mm, containing 2 small appendicoliths. There is no apparent adjacent inflammation, however. No evidence of appendiceal rupture or of a periappendiceal abscess. 2. Periportal edema the liver unclear etiology. This can be seen with hepatic inflammation. 3. No other abnormalities. Electronically Signed   By: Amie Portlandavid  Ormond M.D.   On: 06/18/2018 15:17    Procedures Dr. Corliss Skainssuei (06/18/18) - Laparoscopic Appendectomy   Hospital Course:  Pt is an 17 yo male who [facility] with abdominal pain.  Workup showed appendicitis.  Patient was admitted and underwent procedure listed above.  Tolerated procedure well and was transferred to the floor.  Diet was advanced as tolerated.  On POD#1, the patient was voiding well, tolerating diet, ambulating well, pain well controlled, vital signs stable, incisions c/d/i and felt stable for discharge home.  Patient will follow up in our office in 2 weeks and knows to call with questions or concerns.  He will call to confirm appointment date/time.    Patient was discharged in good condition.  The [place] Substance controlled database was reviewed prior to prescribing narcotic pain medication to this patient.  Physical Exam: General:  Alert, NAD, pleasant, cooperative Cardio: RRR, S1 & S2 normal, no murmur, rubs, gallops Resp: Effort normal, lungs CTA bilaterally, no wheezes, rales, rhonchi Abd:  Soft, ND, normal bowel sounds, no tenderness, incisions C/D/I Extremities: no swelling or TTP of calves b/l Skin: warm and dry  Allergies as of 06/19/2018   No Known Allergies     Medication List    TAKE these medications   calcium carbonate 1250 (500 Ca) MG chewable tablet Commonly known as:  OS-CAL Chew 1  tablet by mouth daily.   ibuprofen 200 MG tablet Commonly known as:  ADVIL,MOTRIN Take 400 mg by mouth every 6 (six) hours as needed for headache or moderate pain. What changed:  Another medication with the same name was removed. Continue taking this medication, and follow the directions you see here.   oxyCODONE 5 MG immediate release tablet Commonly known as:  Oxy IR/ROXICODONE Take 1 tablet (5 mg total) by mouth every 6 (six) hours as needed for moderate pain.        Follow-up Information    [facility] [place]   Specialty:  General Surgery Why:  we are working on a follow up appointment for you. Please call our office to see when your appointment is. please arrive 30 minutes early to complete paperwork. Please bring photo ID and insurance card Contact information: [address] [place] [id number] [phone]          Signed: Joyce C[facility]Jess[facility] L [facility] 06/19/2018, 10:59 AM Pager: [phone] Consults: [phone] Mon-Fri 7:00 am-4:30 pm Sat-Sun 7:00 am-11:30 am

## 2018-08-29 IMAGING — CT CT ABD-PELV W/ CM
2 of 4 series · 16 of 46 positions shown, 18 images · IV contrast (omnipaque)
Comparison: None.

CLINICAL DATA: Abdominal pain.

EXAM:
CT ABDOMEN AND PELVIS WITH CONTRAST
TECHNIQUE: Multidetector CT imaging of the abdomen and pelvis was performed
using the standard protocol following bolus administration of
intravenous contrast.
CONTRAST:  100mL OMNIPAQUE IOHEXOL 300 MG/ML  SOLN

[Series 4: abd/ pelvis 5.0 i30f 2 · axial · 0.60mm/px · z∈[+647,+1052]mm · 13 of 89 slices shown, 15 images]
[im 4/89  soft-tissue]
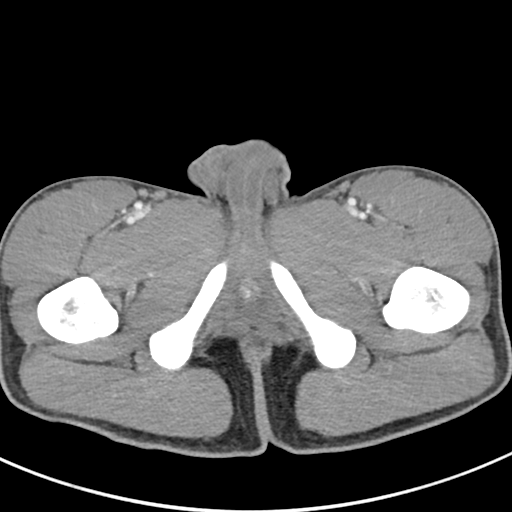
[im 4/89  bone]
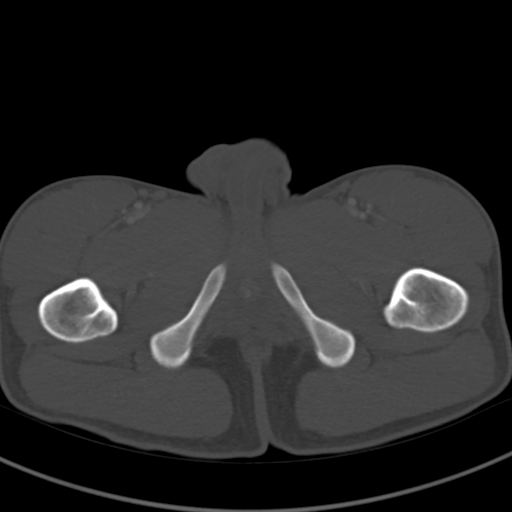
[im 11/89  soft-tissue]
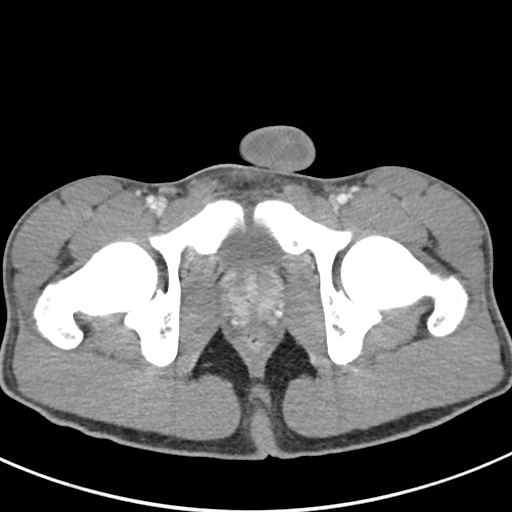
[im 18/89  soft-tissue]
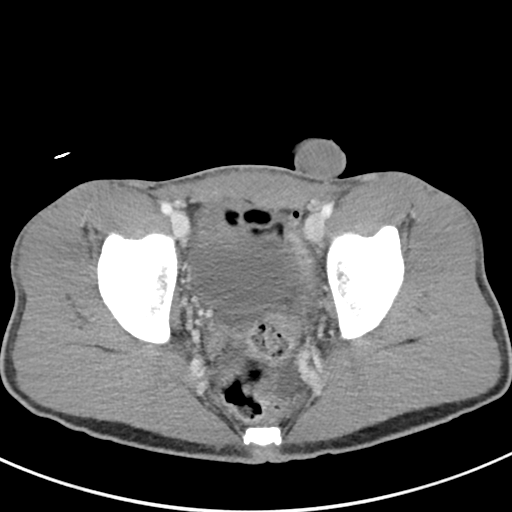
[im 25/89  soft-tissue]
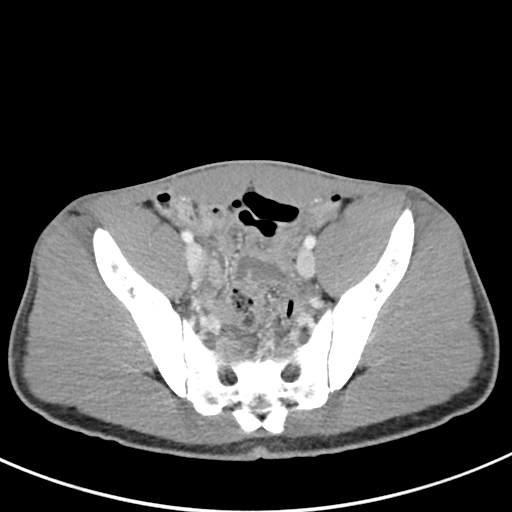
[im 32/89  soft-tissue]
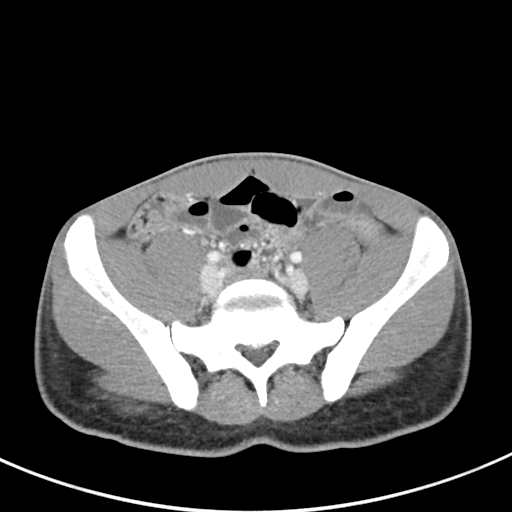
[im 39/89  soft-tissue]
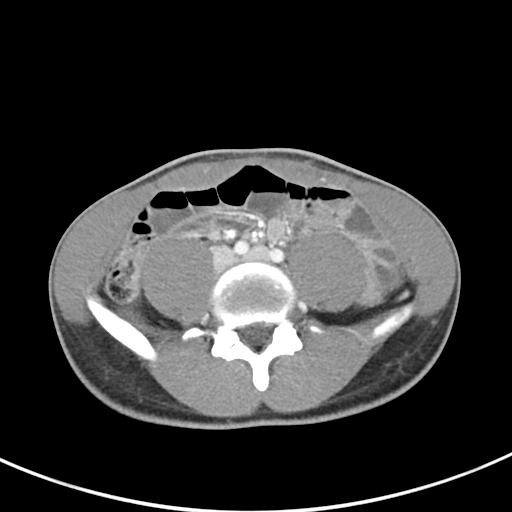
[im 46/89  soft-tissue]
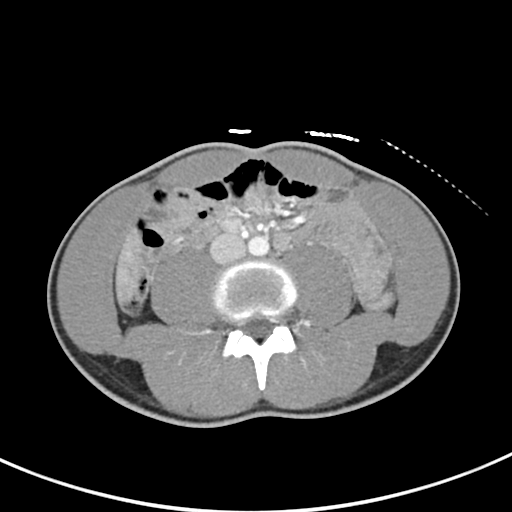
[im 50/89  soft-tissue]
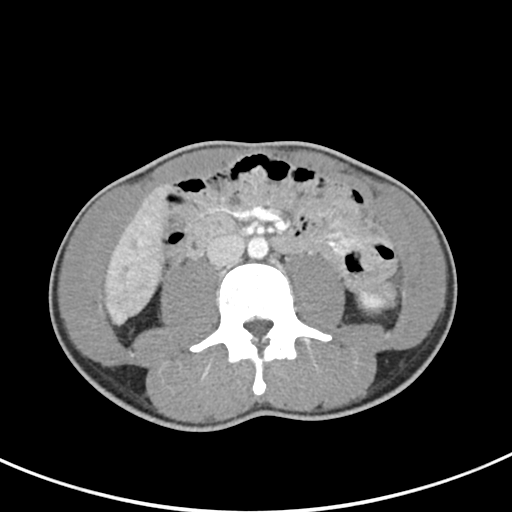
[im 57/89  soft-tissue]
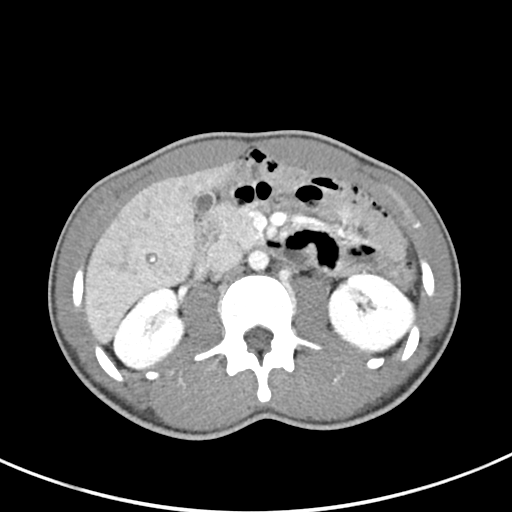
[im 57/89  bone]
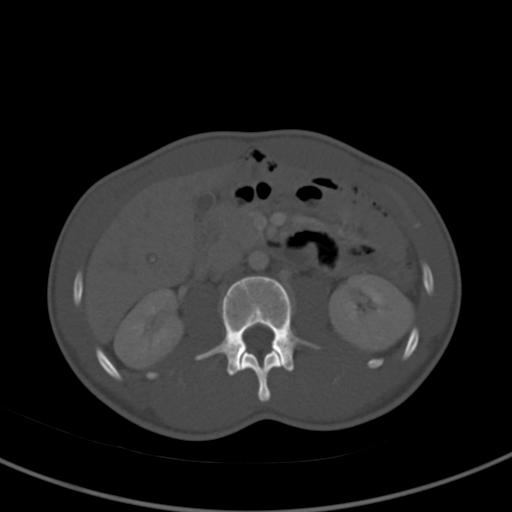
[im 64/89  soft-tissue]
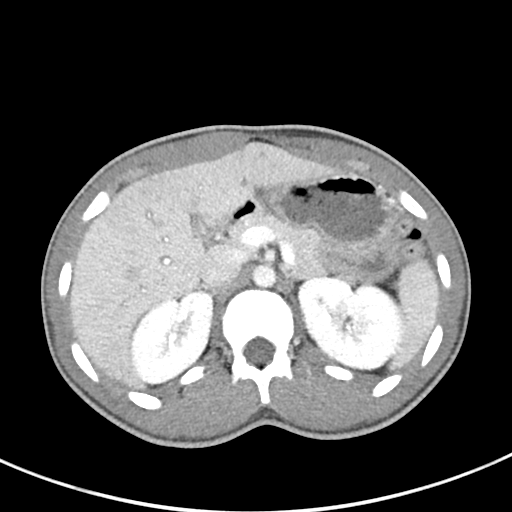
[im 71/89  soft-tissue]
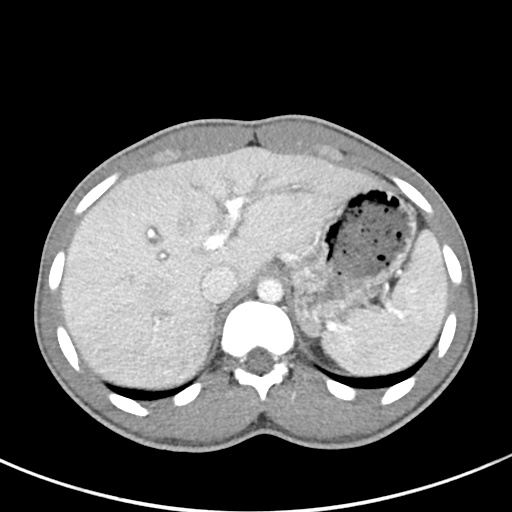
[im 78/89  soft-tissue]
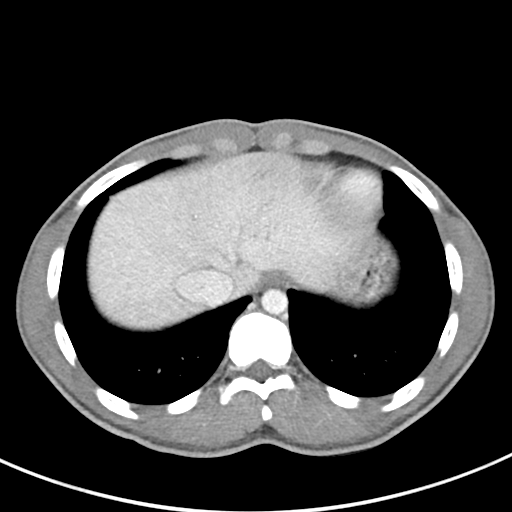
[im 85/89  soft-tissue]
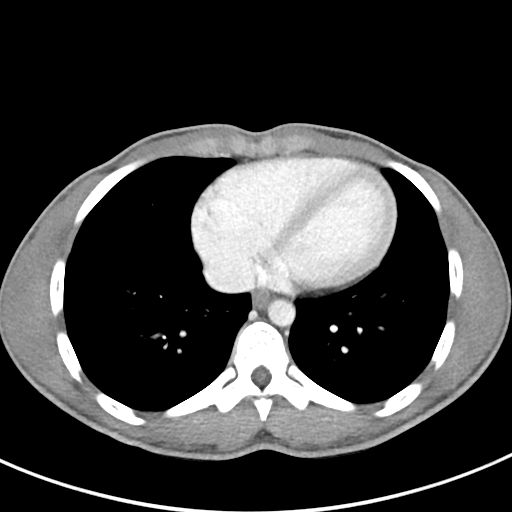

[Series 7: coronal soft tissue · coronal · 0.81mm/px · 3 of 100 slices shown]
[im 34/100  soft-tissue]
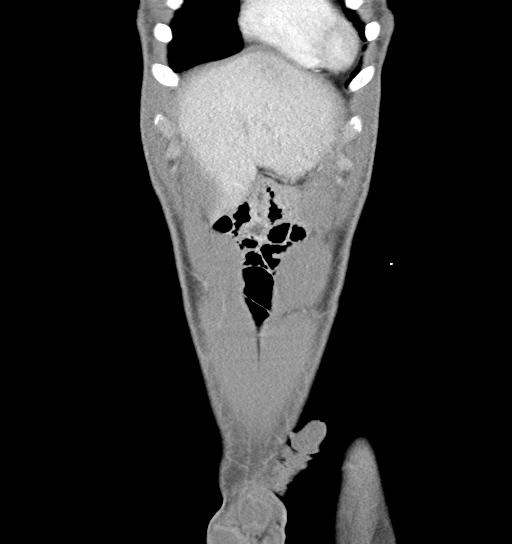
[im 45/100  soft-tissue]
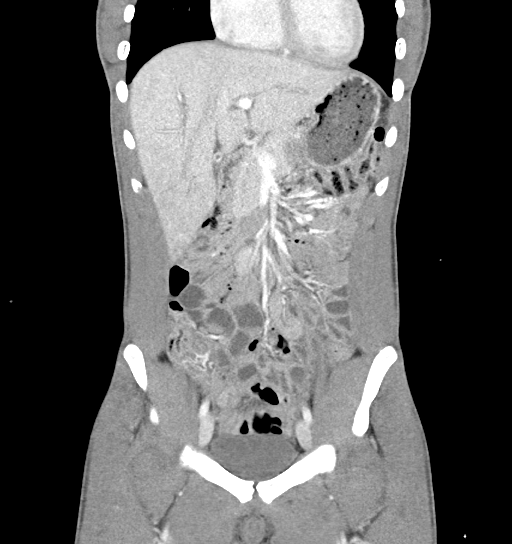
[im 56/100  soft-tissue]
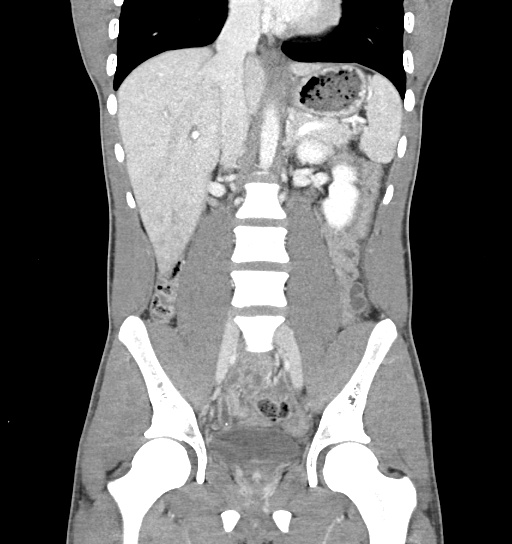

[16 of 46 positions shown; findings below may reference images not displayed]

FINDINGS: Lower chest: Clear lung bases.  Heart normal in size.

Hepatobiliary: Liver is normal in size and attenuation. There is low
attenuation surrounding the portal veins consist with periportal
edema, of unclear etiology. No liver mass or focal lesion. No bile
duct dilation. Gallbladder is mostly collapsed but otherwise
unremarkable.

Pancreas: Choose 1

Spleen: Normal in size without focal abnormality.

Adrenals/Urinary Tract: No adrenal masses. Kidneys normal size,
orientation and position with symmetric enhancement. No renal
masses, stones or hydronephrosis. Visualized portions of the ureters
are normal course and in caliber. Bladder is unremarkable.

Stomach/Bowel: In the right pelvis there is a curled tubular
structure measuring 9 mm in greatest diameter containing too small
stones. This appears to be blind-ending and is consistent with the
appendix. There is no definite associated inflammation, although
this assessment is limited by the limited adjacent fat.

Stomach is unremarkable. Bowel is normal caliber with no wall
thickening inflammation.

Vascular/Lymphatic: No significant vascular findings are present. No
enlarged abdominal or pelvic lymph nodes.

Reproductive: Unremarkable

Other: No abdominal wall hernia or abnormality. No abdominopelvic
ascites.

Musculoskeletal: No acute or significant osseous findings.
IMPRESSION: 1. Findings are consistent with early acute appendicitis. The
appendix lies in the right pelvis, dilated to a maximum of 9 mm,
containing 2 small appendicoliths. There is no apparent adjacent
inflammation, however. No evidence of appendiceal rupture or of a
periappendiceal abscess.
2. Periportal edema the liver unclear etiology. This can be seen
with hepatic inflammation.
3. No other abnormalities.

## 2018-12-15 ENCOUNTER — Encounter: Payer: Self-pay | Admitting: Emergency Medicine

## 2018-12-15 ENCOUNTER — Ambulatory Visit
Admission: EM | Admit: 2018-12-15 | Discharge: 2018-12-15 | Disposition: A | Discharge status: Home / Self Care | Payer: Medicaid Other | Attending: Family Medicine | Admitting: Family Medicine

## 2018-12-15 DIAGNOSIS — J069 Acute upper respiratory infection, unspecified: Secondary | ICD-10-CM

## 2018-12-15 DIAGNOSIS — B9789 Other viral agents as the cause of diseases classified elsewhere: Secondary | ICD-10-CM | POA: Diagnosis not present

## 2018-12-15 MED ORDER — PSEUDOEPH-BROMPHEN-DM 30-2-10 MG/5ML PO SYRP: 5.0000 mL | ORAL_SOLUTION | Freq: Four times a day (QID) | ORAL | 0 refills | Status: DC | PRN

## 2018-12-15 MED ORDER — CETIRIZINE HCL 10 MG PO CAPS: 10.0000 mg | ORAL_CAPSULE | Freq: Every day | ORAL | 0 refills | Status: AC

## 2018-12-15 MED ORDER — FLUTICASONE PROPIONATE 50 MCG/ACT NA SUSP: 1.0000 | Freq: Every day | NASAL | 2 refills | Status: DC

## 2018-12-15 NOTE — Discharge Instructions (Signed)
You likely having a viral upper respiratory infection. We recommended symptom control. I expect your symptoms to start improving in the next 1-2 weeks.   1. Take a daily allergy pill/anti-histamine like Zyrtec, Claritin, or Store brand consistently for 2 weeks  2. For congestion you may try an oral decongestant like Mucinex. You may also try intranasal flonase nasal spray or saline irrigations (neti pot, sinus cleanse)  3. For your sore throat you may try cepacol lozenges, salt water gargles, throat spray. Treatment of congestion may also help your sore throat.  4. For cough you may try cough syrup provided or delsym, Robitussen, Mucinex DM  5. Take Tylenol or Ibuprofen to help with pain/inflammation  6. Stay hydrated, drink plenty of fluids to keep throat coated and less irritated  Honey Tea For cough/sore throat try using a honey-based tea. Use 3 teaspoons of honey with juice squeezed from half lemon. Place shaved pieces of ginger into 1/2-1 cup of water and warm over stove top. Then mix the ingredients and repeat every 4 hours as needed.

## 2018-12-15 NOTE — ED Notes (Signed)
Patient able to ambulate independently  

## 2018-12-15 NOTE — ED Triage Notes (Signed)
Pt presents to [facility] for 3 days of cough, congestion, sore throat, headache, abdominal pain, nausea.  Concerned for pneumonia.

## 2018-12-15 NOTE — ED Provider Notes (Signed)
[facility]    CSN: [id number] Arrival date & time: 12/15/18  1619     History   Chief Complaint Chief Complaint  Patient presents with  . URI    HPI Charles Craig is a 18 y.o. male no contributing past medical history, Patient is presenting with URI symptoms- congestion, cough, sore throat. Patient's main complaints are concerned for pneumonia. Symptoms have been going on for 3 days, states that his symptoms are beginning to improve. Patient has tried orange juice and drinking plenty of fluids, with mild relief. Denies fever, nausea, vomiting, diarrhea. Denies shortness of breath and chest pain.    HPI  History reviewed. No pertinent past medical history.  Patient Active Problem List   Diagnosis Date Noted  . Acute appendicitis 06/18/2018    Past Surgical History:  Procedure Laterality Date  . LAPAROSCOPIC APPENDECTOMY N/A 06/18/2018   Procedure: APPENDECTOMY LAPAROSCOPIC;  Surgeon: Manus Rudd, MD;  Location: [facility] OR;  Service: General;  Laterality: N/A;       Home Medications    Prior to Admission medications   Medication Sig Start Date End Date Taking? Authorizing Provider  brompheniramine-pseudoephedrine-DM 30-2-10 MG/5ML syrup Take 5 mLs by mouth 4 (four) times daily as needed. 12/15/18   Wieters, Hallie C, PA-C  calcium carbonate (OS-CAL) 1250 (500 Ca) MG chewable tablet Chew 1 tablet by mouth daily.    [provider]  Cetirizine HCl 10 MG CAPS Take 1 capsule (10 mg total) by mouth daily for 10 days. 12/15/18 12/25/18  Wieters, Hallie C, PA-C  fluticasone (FLONASE) 50 MCG/ACT nasal spray Place 1-2 sprays into both nostrils daily. 12/15/18   Wieters, Hallie C, PA-C  ibuprofen (ADVIL,MOTRIN) 200 MG tablet Take 400 mg by mouth every 6 (six) hours as needed for headache or moderate pain.    [provider]  oxyCODONE (OXY IR/ROXICODONE) 5 MG immediate release tablet Take 1 tablet (5 mg total) by mouth every 6 (six) hours as needed for  moderate pain. 06/19/18   Jerre Simon, PA    Family History History reviewed. No pertinent family history.  Social History Social History   Tobacco Use  . Smoking status: Passive Smoke Exposure - Never Smoker  . Smokeless tobacco: Never Used  Substance Use Topics  . Alcohol use: No  . Drug use: Yes    Types: Marijuana    Comment: only 3 times     Allergies   Patient has no known allergies.   Review of Systems Review of Systems  Constitutional: Positive for chills. Negative for activity change, appetite change, fatigue and fever.  HENT: Positive for congestion and rhinorrhea. Negative for ear pain, sinus pressure, sore throat and trouble swallowing.   Eyes: Negative for discharge and redness.  Respiratory: Positive for cough. Negative for chest tightness and shortness of breath.   Cardiovascular: Negative for chest pain.  Gastrointestinal: Negative for abdominal pain, diarrhea, nausea and vomiting.  Musculoskeletal: Negative for myalgias.  Skin: Negative for rash.  Neurological: Negative for dizziness, light-headedness and headaches.     Physical Exam Triage Vital Signs ED Triage Vitals [12/15/18 1627]  Enc Vitals Group     BP 129/84     Pulse Rate 87     Resp 18     Temp 98.3 F (36.8 C)     Temp Source Oral     SpO2 96 %     Weight      Height      Head Circumference  Peak Flow      Pain Score 3     Pain Loc      Pain Edu?      Excl. in GC?    No data found.  Updated Vital Signs BP 129/84 (BP Location: Left Arm)   Pulse 87   Temp 98.3 F (36.8 C) (Oral)   Resp 18   SpO2 96%   Visual Acuity Right Eye Distance:   Left Eye Distance:   Bilateral Distance:    Right Eye Near:   Left Eye Near:    Bilateral Near:     Physical Exam Vitals signs and nursing note reviewed.  Constitutional:      Appearance: He is well-developed.  HENT:     Head: Normocephalic and atraumatic.     Ears:     Comments: Bilateral ears without tenderness to  palpation of external auricle, tragus and mastoid, EAC's without erythema or swelling, TM's with good bony landmarks and cone of light. Non erythematous.    Nose:     Comments: Nasal mucosa slightly erythematous, nonswollen turbinates    Mouth/Throat:     Comments: Oral mucosa pink and moist, no tonsillar enlargement or exudate. Posterior pharynx patent and nonerythematous, no uvula deviation or swelling. Normal phonation.  Eyes:     Conjunctiva/sclera: Conjunctivae normal.  Neck:     Musculoskeletal: Neck supple.  Cardiovascular:     Rate and Rhythm: Normal rate and regular rhythm.     Heart sounds: No murmur.  Pulmonary:     Effort: Pulmonary effort is normal. No respiratory distress.     Breath sounds: Normal breath sounds.     Comments: Breathing comfortably at rest, CTABL, no wheezing, rales or other adventitious sounds auscultated Abdominal:     Palpations: Abdomen is soft.     Tenderness: There is no abdominal tenderness.  Skin:    General: Skin is warm and dry.  Neurological:     Mental Status: He is alert.      UC Treatments / Results  Labs (all labs ordered are listed, but only abnormal results are displayed) Labs Reviewed - No data to display  EKG None  Radiology No results found.  Procedures Procedures (including critical care time)  Medications Ordered in UC Medications - No data to display  Initial Impression / Assessment and Plan / UC Course  I have reviewed the triage vital signs and the nursing notes.  Pertinent labs & imaging results that were available during my care of the patient were reviewed by me and considered in my medical decision making (see chart for details).    URI symptoms x3 days, patient with reported improvement in symptoms, vital signs stable, exam nonfocal, lungs clear, most likely viral etiology will recommend continued symptomatic and supportive care.  At this time do not suspect underlying pneumonia.  Will have patient to  continue to monitor symptoms and follow-up if not continuing to have improvement.Discussed strict return precautions. Patient verbalized understanding and is agreeable with plan.   Final Clinical Impressions(s) / UC Diagnoses   Final diagnoses:  Viral URI with cough     Discharge Instructions     You likely having a viral upper respiratory infection. We recommended symptom control. I expect your symptoms to start improving in the next 1-2 weeks.   1. Take a daily allergy pill/anti-histamine like Zyrtec, Claritin, or Store brand consistently for 2 weeks  2. For congestion you may try an oral decongestant like Mucinex. You may also try  intranasal flonase nasal spray or saline irrigations (neti pot, sinus cleanse)  3. For your sore throat you may try cepacol lozenges, salt water gargles, throat spray. Treatment of congestion may also help your sore throat.  4. For cough you may try cough syrup provided or delsym, Robitussen, Mucinex DM  5. Take Tylenol or Ibuprofen to help with pain/inflammation  6. Stay hydrated, drink plenty of fluids to keep throat coated and less irritated  Honey Tea For cough/sore throat try using a honey-based tea. Use 3 teaspoons of honey with juice squeezed from half lemon. Place shaved pieces of ginger into 1/2-1 cup of water and warm over stove top. Then mix the ingredients and repeat every 4 hours as needed.   ED Prescriptions    Medication Sig Dispense Auth. Provider   Cetirizine HCl 10 MG CAPS Take 1 capsule (10 mg total) by mouth daily for 10 days. 10 capsule Wieters, Hallie C, PA-C   fluticasone (FLONASE) 50 MCG/ACT nasal spray Place 1-2 sprays into both nostrils daily. 1 g Wieters, Hallie C, PA-C   brompheniramine-pseudoephedrine-DM 30-2-10 MG/5ML syrup Take 5 mLs by mouth 4 (four) times daily as needed. 120 mL Wieters, Hallie C, PA-C     Controlled Substance Prescriptions [place] Controlled Substance Registry consulted? Not Applicable   Lew Dawes, [place] 12/15/18 1728

## 2020-02-21 ENCOUNTER — Emergency Department (HOSPITAL_COMMUNITY)
Admission: EM | Admit: 2020-02-21 | Discharge: 2020-02-21 | Disposition: A | Discharge status: Home / Self Care | Payer: Medicaid Other | Attending: Emergency Medicine | Admitting: Emergency Medicine

## 2020-02-21 ENCOUNTER — Encounter (HOSPITAL_COMMUNITY): Payer: Self-pay

## 2020-02-21 ENCOUNTER — Other Ambulatory Visit: Payer: Self-pay

## 2020-02-21 DIAGNOSIS — Z7722 Contact with and (suspected) exposure to environmental tobacco smoke (acute) (chronic): Secondary | ICD-10-CM | POA: Insufficient documentation

## 2020-02-21 DIAGNOSIS — J302 Other seasonal allergic rhinitis: Secondary | ICD-10-CM | POA: Insufficient documentation

## 2020-02-21 MED ORDER — FLUTICASONE PROPIONATE 50 MCG/ACT NA SUSP: 2.0000 | Freq: Every day | NASAL | 0 refills | Status: AC

## 2020-02-21 NOTE — ED Triage Notes (Signed)
Pt arrives to ED w/ c/o seasonal allergies.

## 2020-02-21 NOTE — ED Provider Notes (Signed)
[facility] EMERGENCY DEPARTMENT Provider Note   CSN: [id number] Arrival date & time: 02/21/20  1644     History Chief Complaint  Patient presents with  . Allergies    Charles Craig is a 19 y.o. male.  Patient presents with approximately 1 week history of nasal congestion, decreased ability to smell, and ear fullness.  Patient has a history of seasonal allergies.  He takes Zyrtec every now and then whenever he feels like he is having symptoms.  He has had no fever, cough, vomiting or diarrhea.  He denies any Covid contacts.        History reviewed. No pertinent past medical history.  Patient Active Problem List   Diagnosis Date Noted  . Acute appendicitis 06/18/2018    Past Surgical History:  Procedure Laterality Date  . LAPAROSCOPIC APPENDECTOMY N/A 06/18/2018   Procedure: APPENDECTOMY LAPAROSCOPIC;  Surgeon: Manus Rudd, MD;  Location: [facility] OR;  Service: General;  Laterality: N/A;       History reviewed. No pertinent family history.  Social History   Tobacco Use  . Smoking status: Passive Smoke Exposure - Never Smoker  . Smokeless tobacco: Never Used  Substance Use Topics  . Alcohol use: No  . Drug use: Yes    Types: Marijuana    Comment: only 3 times    Home Medications Prior to Admission medications   Medication Sig Start Date End Date Taking? Authorizing Provider  brompheniramine-pseudoephedrine-DM 30-2-10 MG/5ML syrup Take 5 mLs by mouth 4 (four) times daily as needed. 12/15/18   Wieters, Hallie C, PA-C  calcium carbonate (OS-CAL) 1250 (500 Ca) MG chewable tablet Chew 1 tablet by mouth daily.    [provider]  Cetirizine HCl 10 MG CAPS Take 1 capsule (10 mg total) by mouth daily for 10 days. 12/15/18 12/25/18  Wieters, Hallie C, PA-C  fluticasone (FLONASE) 50 MCG/ACT nasal spray Place 2 sprays into both nostrils daily. 02/21/20   Orma Flaming, NP  ibuprofen (ADVIL,MOTRIN) 200 MG tablet Take 400 mg by mouth every 6 (six)  hours as needed for headache or moderate pain.    [provider]  oxyCODONE (OXY IR/ROXICODONE) 5 MG immediate release tablet Take 1 tablet (5 mg total) by mouth every 6 (six) hours as needed for moderate pain. 06/19/18   Jerre Simon, PA    Allergies    Patient has no known allergies.  Review of Systems   Review of Systems  Constitutional: Negative for chills and fever.  HENT: Negative for ear pain and sore throat.   Eyes: Negative for pain and visual disturbance.  Respiratory: Negative for cough and shortness of breath.   Cardiovascular: Negative for chest pain and palpitations.  Gastrointestinal: Negative for abdominal pain, diarrhea, nausea and vomiting.  Genitourinary: Negative for dysuria and hematuria.  Musculoskeletal: Negative for arthralgias and back pain.  Skin: Negative for color change and rash.  Allergic/Immunologic: Positive for environmental allergies.  Neurological: Negative for seizures, syncope, light-headedness and headaches.  All other systems reviewed and are negative.   Physical Exam Updated Vital Signs BP 135/75 (BP Location: Left Arm)   Pulse 99   Temp 98.4 F (36.9 C) (Oral)   Resp 14   Ht 5\' 7"  (1.702 m)   Wt 62.6 kg   SpO2 98%   BMI 21.61 kg/m   Physical Exam Vitals and nursing note reviewed.  Constitutional:      General: He is not in acute distress.    Appearance: Normal appearance.  He is well-developed and normal weight. He is not ill-appearing.  HENT:     Head: Normocephalic and atraumatic.     Right Ear: Tympanic membrane, ear canal and external ear normal.     Left Ear: Tympanic membrane, ear canal and external ear normal.     Nose: Congestion present.     Right Turbinates: Pale.     Left Turbinates: Pale.     Mouth/Throat:     Mouth: Mucous membranes are moist.     Pharynx: Oropharynx is clear. Uvula midline. No pharyngeal swelling, oropharyngeal exudate or posterior oropharyngeal erythema.     Comments: OP  cobblestoning  Eyes:     General: No scleral icterus.       Right eye: No discharge.        Left eye: No discharge.     Extraocular Movements: Extraocular movements intact.     Conjunctiva/sclera: Conjunctivae normal.     Pupils: Pupils are equal, round, and reactive to light.  Cardiovascular:     Rate and Rhythm: Normal rate and regular rhythm.     Heart sounds: No murmur.  Pulmonary:     Effort: Pulmonary effort is normal. No respiratory distress.     Breath sounds: Normal breath sounds.  Abdominal:     General: Abdomen is flat. Bowel sounds are normal. There is no distension.     Palpations: Abdomen is soft.     Tenderness: There is no abdominal tenderness. There is no right CVA tenderness, left CVA tenderness, guarding or rebound.  Musculoskeletal:        General: Normal range of motion.     Cervical back: Normal range of motion and neck supple.  Skin:    General: Skin is warm and dry.     Capillary Refill: Capillary refill takes less than 2 seconds.  Neurological:     General: No focal deficit present.     Mental Status: He is alert. Mental status is at baseline.     Cranial Nerves: No cranial nerve deficit.     Motor: No weakness.     ED Results / Procedures / Treatments   Labs (all labs ordered are listed, but only abnormal results are displayed) Labs Reviewed - No data to display  EKG None  Radiology No results found.  Procedures Procedures (including critical care time)  Medications Ordered in ED Medications - No data to display  ED Course  I have reviewed the triage vital signs and the nursing notes.  Pertinent labs & imaging results that were available during my care of the patient were reviewed by me and considered in my medical decision making (see chart for details).    MDM Rules/Calculators/A&P                      43-year-old male with a past medical history of seasonal allergies who presents to the emergency department with worsening seasonal  allergy symptoms for about a week.  He is attempted to use saline nasal spray and Zyrtec for the past 2 days without relief in his symptoms.  Patient endorses that he does not take Zyrtec daily to help with his allergy symptoms.  He has had no fever, cough, throat pain, abdominal pain or vomiting/diarrhea.  On exam patient with neuro neurological exam.  PERRLA 3 mm bilaterally, no injected conjunctiva.  Turbinates pale bilaterally.  Ear exam benign.  OP is mildly erythemic with cobblestoning.  No cervical lymphadenopathy.  Lungs are clear to  auscultation bilaterally without wheezing or decreased air movement.  Abdomen is soft, flat, nondistended and nontender.  Times are consistent with seasonal allergies.  Discussed supportive care at home, including taking his Zyrtec daily to help with his allergy symptoms.  Also discussed using Flonase for his nasal symptoms.  Return precautions provided and patient verbalizes understanding of this information.  He is stable for discharge at this time. Final Clinical Impression(s) / ED Diagnoses Final diagnoses:  Seasonal allergies    Rx / DC Orders ED Discharge Orders         Ordered    fluticasone (FLONASE) 50 MCG/ACT nasal spray  Daily     02/21/20 1718           Anthoney Harada, NP 02/21/20 1720    Theroux, Lindly A., DO 02/21/20 2045

## 2020-02-21 NOTE — Discharge Instructions (Addendum)
Take your zyrtec daily in order to help your allergy symptoms. You can also use flonase, 1-2 sprays to each nostril daily. Follow up with your primary care provider as needed.

## 2020-05-02 ENCOUNTER — Ambulatory Visit
Admission: EM | Admit: 2020-05-02 | Discharge: 2020-05-02 | Disposition: A | Discharge status: Home / Self Care | Payer: Self-pay

## 2020-05-02 DIAGNOSIS — R430 Anosmia: Secondary | ICD-10-CM

## 2020-05-02 NOTE — Discharge Instructions (Signed)
Follow up with ENT for further evaluation

## 2020-05-02 NOTE — ED Provider Notes (Signed)
[facility]    CSN: [id number] Arrival date & time: 05/02/20  1757      History   Chief Complaint Chief Complaint  Patient presents with  . loss of smell    HPI Charles Craig is a 19 y.o. male.   19 year old male comes in for 1 month history of loss of smell. At first had URI symptoms, and was given flonase and zyrtec. States tried for 2 weeks without improvement and stopped. URI symptoms resolved. Denies fever. Negative COVID test.      History reviewed. No pertinent past medical history.  Patient Active Problem List   Diagnosis Date Noted  . Acute appendicitis 06/18/2018    Past Surgical History:  Procedure Laterality Date  . LAPAROSCOPIC APPENDECTOMY N/A 06/18/2018   Procedure: APPENDECTOMY LAPAROSCOPIC;  Surgeon: Donnie Mesa, MD;  Location: [place];  Service: General;  Laterality: N/A;       Home Medications    Prior to Admission medications   Medication Sig Start Date End Date Taking? Authorizing Provider  calcium carbonate (OS-CAL) 1250 (500 Ca) MG chewable tablet Chew 1 tablet by mouth daily.    [provider]  Cetirizine HCl 10 MG CAPS Take 1 capsule (10 mg total) by mouth daily for 10 days. 12/15/18 12/25/18  Wieters, Hallie C, PA-C  fluticasone (FLONASE) 50 MCG/ACT nasal spray Place 2 sprays into both nostrils daily. 02/21/20   Anthoney Harada, NP    Family History History reviewed. No pertinent family history.  Social History Social History   Tobacco Use  . Smoking status: Passive Smoke Exposure - Never Smoker  . Smokeless tobacco: Never Used  Substance Use Topics  . Alcohol use: No  . Drug use: Yes    Types: Marijuana    Comment: only 3 times     Allergies   Patient has no known allergies.   Review of Systems Review of Systems  Reason unable to perform ROS: See HPI as above.     Physical Exam Triage Vital Signs ED Triage Vitals  Enc Vitals Group     BP 05/02/20 1816 120/74     Pulse Rate 05/02/20 1816  74     Resp 05/02/20 1816 16     Temp 05/02/20 1816 98 F (36.7 C)     Temp Source 05/02/20 1816 Oral     SpO2 05/02/20 1816 98 %     Weight --      Height --      Head Circumference --      Peak Flow --      Pain Score 05/02/20 1842 0     Pain Loc --      Pain Edu? --      Excl. in [place]? --    No data found.  Updated Vital Signs BP 120/74 (BP Location: Right Arm)   Pulse 74   Temp 98 F (36.7 C) (Oral)   Resp 16   SpO2 98%   Physical Exam Constitutional:      General: He is not in acute distress.    Appearance: Normal appearance. He is well-developed. He is not toxic-appearing or diaphoretic.  HENT:     Head: Normocephalic and atraumatic.     Nose: No congestion or rhinorrhea.     Right Sinus: No maxillary sinus tenderness or frontal sinus tenderness.     Left Sinus: No maxillary sinus tenderness or frontal sinus tenderness.  Eyes:     Conjunctiva/sclera: Conjunctivae normal.  Pupils: Pupils are equal, round, and reactive to light.  Pulmonary:     Effort: Pulmonary effort is normal. No respiratory distress.     Comments: Speaking in full sentences without difficulty Musculoskeletal:     Cervical back: Normal range of motion and neck supple.  Skin:    General: Skin is warm and dry.  Neurological:     Mental Status: He is alert and oriented to person, place, and time.      UC Treatments / Results  Labs (all labs ordered are listed, but only abnormal results are displayed) Labs Reviewed - No data to display  EKG   Radiology No results found.  Procedures Procedures (including critical care time)  Medications Ordered in UC Medications - No data to display  Initial Impression / Assessment and Plan / UC Course  I have reviewed the triage vital signs and the nursing notes.  Pertinent labs & imaging results that were available during my care of the patient were reviewed by me and considered in my medical decision making (see chart for details).    1  month loss of smell. Normal exam. Follow up with ENT.   Final Clinical Impressions(s) / UC Diagnoses   Final diagnoses:  Loss of smell    ED Prescriptions    None     PDMP not reviewed this encounter.   Belinda Fisher, PA-C 05/02/20 [phone]

## 2020-05-02 NOTE — ED Triage Notes (Signed)
Pt c/o loss of smell over a month ago. States had a neg covid test yesterday.

## 2022-03-04 ENCOUNTER — Ambulatory Visit
Admission: EM | Admit: 2022-03-04 | Discharge: 2022-03-04 | Disposition: A | Discharge status: Home / Self Care | Payer: Self-pay | Attending: Internal Medicine | Admitting: Internal Medicine

## 2022-03-04 DIAGNOSIS — R590 Localized enlarged lymph nodes: Secondary | ICD-10-CM

## 2022-03-04 NOTE — ED Notes (Signed)
Set up appointment with patient for pcp and assisted patient with mychart set up.  ?

## 2022-03-04 NOTE — ED Provider Notes (Signed)
?[facility] ? ? ? ?CSN: [id number] ?Arrival date & time: 03/04/22  1006 ? ? ?  ? ?History   ?Chief Complaint ?Chief Complaint  ?Patient presents with  ? Lymphadenopathy  ? ? ?HPI ?Charles Craig is a 21 y.o. male.  ? ?Patient presents with lumps in the posterior neck that have been present for a few days.  Patient not sure the exact timeframe of when symptoms started.  Lymph node swelling is not painful.  Denies any drainage from the lumps.  Denies any recent fever, nasal congestion, sore throat, ear pain, skin infection, insect bite, spider bite, hair loss, headache.  Denies any recent associated symptoms to this.  Also requesting a "checkup" because he does not have a PCP. ? ? ? ?History reviewed. No pertinent past medical history. ? ?Patient Active Problem List  ? Diagnosis Date Noted  ? Acute appendicitis 06/18/2018  ? ? ?Past Surgical History:  ?Procedure Laterality Date  ? APPENDECTOMY    ? LAPAROSCOPIC APPENDECTOMY N/A 06/18/2018  ? Procedure: APPENDECTOMY LAPAROSCOPIC;  Surgeon: Manus Rudd, MD;  Location: [facility] OR;  Service: General;  Laterality: N/A;  ? ? ? ? ? ?Home Medications   ? ?Prior to Admission medications   ?Medication Sig Start Date End Date Taking? Authorizing Provider  ?calcium carbonate (OS-CAL) 1250 (500 Ca) MG chewable tablet Chew 1 tablet by mouth daily.    [provider]  ?Cetirizine HCl 10 MG CAPS Take 1 capsule (10 mg total) by mouth daily for 10 days. 12/15/18 12/25/18  Wieters, Hallie C, PA-C  ?fluticasone (FLONASE) 50 MCG/ACT nasal spray Place 2 sprays into both nostrils daily. 02/21/20   Orma Flaming, NP  ? ? ?Family History ?History reviewed. No pertinent family history. ? ?Social History ?Social History  ? ?Tobacco Use  ? Smoking status: Never  ?  Passive exposure: Yes  ? Smokeless tobacco: Never  ?Vaping Use  ? Vaping Use: Never used  ?Substance Use Topics  ? Alcohol use: No  ? Drug use: Yes  ?  Types: Marijuana  ?  Comment: only 3 times  ? ? ? ?Allergies    ?Patient has no known allergies. ? ? ?Review of Systems ?Review of Systems ?Per HPI ? ?Physical Exam ?Triage Vital Signs ?ED Triage Vitals  ?Enc Vitals Group  ?   BP 03/04/22 1026 112/70  ?   Pulse Rate 03/04/22 1026 69  ?   Resp 03/04/22 1026 16  ?   Temp 03/04/22 1026 97.7 ?F (36.5 ?C)  ?   Temp Source 03/04/22 1026 Oral  ?   SpO2 03/04/22 1026 97 %  ?   Weight 03/04/22 1024 137 lb (62.1 kg)  ?   Height 03/04/22 1024 5\' 8"  (1.727 m)  ?   Head Circumference --   ?   Peak Flow --   ?   Pain Score 03/04/22 1024 0  ?   Pain Loc --   ?   Pain Edu? --   ?   Excl. in [place]? --   ? ?No data found. ? ?Updated Vital Signs ?BP 112/70 (BP Location: Right Arm)   Pulse 69   Temp 97.7 ?F (36.5 ?C) (Oral)   Resp 16   Ht 5\' 8"  (1.727 m)   Wt 137 lb (62.1 kg)   SpO2 97%   BMI 20.83 kg/m?  ? ?Visual Acuity ?Right Eye Distance:   ?Left Eye Distance:   ?Bilateral Distance:   ? ?Right Eye Near:   ?  Left Eye Near:    ?Bilateral Near:    ? ?Physical Exam ?Constitutional:   ?   General: He is not in acute distress. ?   Appearance: Normal appearance. He is not toxic-appearing or diaphoretic.  ?HENT:  ?   Head: Normocephalic and atraumatic.  ?   Right Ear: Tympanic membrane and ear canal normal.  ?   Left Ear: Tympanic membrane and ear canal normal.  ?   Nose: Nose normal.  ?   Mouth/Throat:  ?   Mouth: Mucous membranes are moist.  ?   Pharynx: No posterior oropharyngeal erythema.  ?Eyes:  ?   Extraocular Movements: Extraocular movements intact.  ?   Conjunctiva/sclera: Conjunctivae normal.  ?   Pupils: Pupils are equal, round, and reactive to light.  ?Cardiovascular:  ?   Rate and Rhythm: Normal rate and regular rhythm.  ?   Pulses: Normal pulses.  ?   Heart sounds: Normal heart sounds.  ?Pulmonary:  ?   Effort: Pulmonary effort is normal. No respiratory distress.  ?   Breath sounds: Normal breath sounds.  ?Lymphadenopathy:  ?   Head:  ?   Right side of head: Occipital adenopathy present.  ?   Left side of head: Occipital adenopathy  present.  ?Skin: ?   Comments: No obvious signs of superficial skin infection, insect bite, spider bite surrounding lymph node swelling.  ?Neurological:  ?   General: No focal deficit present.  ?   Mental Status: He is alert and oriented to person, place, and time. Mental status is at baseline.  ?Psychiatric:     ?   Mood and Affect: Mood normal.     ?   Behavior: Behavior normal.     ?   Thought Content: Thought content normal.     ?   Judgment: Judgment normal.  ? ? ? ?UC Treatments / Results  ?Labs ?(all labs ordered are listed, but only abnormal results are displayed) ?Labs Reviewed  ?COMPREHENSIVE METABOLIC PANEL  ?CBC  ? ? ?EKG ? ? ?Radiology ?No results found. ? ?Procedures ?Procedures (including critical care time) ? ?Medications Ordered in UC ?Medications - No data to display ? ?Initial Impression / Assessment and Plan / UC Course  ?I have reviewed the triage vital signs and the nursing notes. ? ?Pertinent labs & imaging results that were available during my care of the patient were reviewed by me and considered in my medical decision making (see chart for details). ? ?  ? ?No obvious indications for etiology of occipital lymph node swelling.  Will obtain CMP and CBC to determine if signs of infection or other worrisome causes are involved.  Advised patient to monitor lymph node swelling and follow-up if it becomes worse or if any new symptoms develop.  Patient was set up with PCP for discharge to have this further evaluated and managed as well as to have a physical completed.  Advised patient that physicals and regular checkups are not routinely completed at urgent care and this will need to be done by PCP.  Discussed return precautions.  Patient verbalized understanding and was agreeable with plan. ?Final Clinical Impressions(s) / UC Diagnoses  ? ?Final diagnoses:  ?Localized enlarged lymph nodes  ? ? ? ?Discharge Instructions   ? ?  ?Your blood work is pending. We will call if there are any  abnormalities. Please follow up sooner if you develop new symptoms or if lymph nodes become larger. Please follow up with primary care doctor  as soon as possible to have further evaluation and management.  ? ? ? ? ?ED Prescriptions   ?None ?  ? ?PDMP not reviewed this encounter. ?  ?Gustavus BryantMound, Lalaine Overstreet E, [place]NP ?03/04/22 1112 ? ?

## 2022-03-04 NOTE — Discharge Instructions (Signed)
Your blood work is pending. We will call if there are any abnormalities. Please follow up sooner if you develop new symptoms or if lymph nodes become larger. Please follow up with primary care doctor as soon as possible to have further evaluation and management.  ?

## 2022-03-04 NOTE — ED Triage Notes (Signed)
Patient presents to Urgent Care with complaints of neck lump on l side since last week. Patient reports he would also like to be checked up he has never had pcp or insurance and would like information on this.  ? ?

## 2022-03-05 LAB — CBC
Hematocrit: 47.8 % (ref 37.5–51.0)
Hemoglobin: 15.8 g/dL (ref 13.0–17.7)
MCH: 28.6 pg (ref 26.6–33.0)
MCHC: 33.1 g/dL (ref 31.5–35.7)
MCV: 86 fL (ref 79–97)
Platelets: 188 10*3/uL (ref 150–450)
RBC: 5.53 x10E6/uL (ref 4.14–5.80)
RDW: 12 % (ref 11.6–15.4)
WBC: 3.3 10*3/uL — ABNORMAL LOW (ref 3.4–10.8)

## 2022-03-05 LAB — COMPREHENSIVE METABOLIC PANEL
ALT: 13 IU/L (ref 0–44)
AST: 30 IU/L (ref 0–40)
Albumin/Globulin Ratio: 1.8 (ref 1.2–2.2)
Albumin: 4.6 g/dL (ref 4.1–5.2)
Alkaline Phosphatase: 80 IU/L (ref 44–121)
BUN/Creatinine Ratio: 17 (ref 9–20)
BUN: 17 mg/dL (ref 6–20)
Bilirubin Total: 0.8 mg/dL (ref 0.0–1.2)
CO2: 24 mmol/L (ref 20–29)
Calcium: 9.6 mg/dL (ref 8.7–10.2)
Chloride: 102 mmol/L (ref 96–106)
Creatinine, Ser: 0.98 mg/dL (ref 0.76–1.27)
Globulin, Total: 2.6 g/dL (ref 1.5–4.5)
Glucose: 71 mg/dL (ref 70–99)
Potassium: 5.3 mmol/L — ABNORMAL HIGH (ref 3.5–5.2)
Sodium: 141 mmol/L (ref 134–144)
Total Protein: 7.2 g/dL (ref 6.0–8.5)
eGFR: 112 mL/min/{1.73_m2} (ref >59)

## 2022-05-26 ENCOUNTER — Ambulatory Visit: Payer: Self-pay | Admitting: Family Medicine

## 2024-05-23 ENCOUNTER — Emergency Department (HOSPITAL_BASED_OUTPATIENT_CLINIC_OR_DEPARTMENT_OTHER)
Admission: EM | Admit: 2024-05-23 | Discharge: 2024-05-23 | Disposition: A | Discharge status: Home / Self Care | Payer: Self-pay | Attending: Emergency Medicine | Admitting: Emergency Medicine

## 2024-05-23 ENCOUNTER — Encounter (HOSPITAL_BASED_OUTPATIENT_CLINIC_OR_DEPARTMENT_OTHER): Payer: Self-pay | Admitting: Emergency Medicine

## 2024-05-23 ENCOUNTER — Emergency Department (HOSPITAL_BASED_OUTPATIENT_CLINIC_OR_DEPARTMENT_OTHER): Payer: Self-pay

## 2024-05-23 ENCOUNTER — Other Ambulatory Visit: Payer: Self-pay

## 2024-05-23 DIAGNOSIS — M25561 Pain in right knee: Secondary | ICD-10-CM | POA: Diagnosis present

## 2024-05-23 MED ORDER — CYCLOBENZAPRINE HCL 5 MG PO TABS
7.5000 mg | ORAL_TABLET | Freq: Once | ORAL | Status: AC
  Administered 2024-05-23: 7.5 mg via ORAL
  Filled 2024-05-23: qty 2

## 2024-05-23 MED ORDER — IBUPROFEN 400 MG PO TABS
600.0000 mg | ORAL_TABLET | Freq: Once | ORAL | Status: AC
  Administered 2024-05-23: 600 mg via ORAL
  Filled 2024-05-23: qty 1

## 2024-05-23 NOTE — ED Triage Notes (Signed)
 Pt via pov from home with knee pain. He states he was sitting and it somehow moved and he is unable to straighten it without significant pain. Pt  a&o x 4; nad noted.

## 2024-05-23 NOTE — ED Provider Notes (Signed)
 [facility] AT [facility] Provider Note   CSN: [id number] Arrival date & time: 05/23/24  1845     Patient presents with: Knee Pain   Charles Craig is a 23 y.o. male with no significant past medical history who presents with concern for right knee pain that started earlier today when he went to sit down.  States it is difficult to extend the knee out fully due to pain.  He denies any numbness or tingling in his right lower extremity.  Denies any other known injuries to the area.  States this happens occasionally, but usually pops back into place.    Knee Pain      Prior to Admission medications   Medication Sig Start Date End Date Taking? Authorizing Provider  calcium carbonate (OS-CAL) 1250 (500 Ca) MG chewable tablet Chew 1 tablet by mouth daily.    [provider]  Cetirizine  HCl 10 MG CAPS Take 1 capsule (10 mg total) by mouth daily for 10 days. 12/15/18 12/25/18  Wieters, Hallie C, PA-C  fluticasone  (FLONASE ) 50 MCG/ACT nasal spray Place 2 sprays into both nostrils daily. 02/21/20   Erasmo Waddell SAUNDERS, NP    Allergies: Patient has no known allergies.    Review of Systems  Musculoskeletal:        Right knee pain    Updated Vital Signs BP 117/75   Pulse 74   Temp 97.8 F (36.6 C) (Oral)   Resp 18   Ht 5' 8 (1.727 m)   Wt 62.1 kg   SpO2 100%   BMI 20.82 kg/m   Physical Exam Vitals and nursing note reviewed.  Constitutional:      Appearance: Normal appearance.  HENT:     Head: Atraumatic.  Cardiovascular:     Comments: Pedal pulses 2+ in the right foot Pulmonary:     Effort: Pulmonary effort is normal.  Musculoskeletal:       Legs:     Comments: Right lower extremity:  General Mild soft tissue edema along the proximal-medial aspect of the right knee as seen in diagram.  No obvious deformity. No erythema, contusions, open wounds   Palpation Non tender over the femur Nontender along the tibia and fibula, patella,  MCL, LCL Nontender on the lateral and medial malleolus Non-tender of the popliteal fossa  ROM Knee extension to approximately 30 degrees before patient reports pain and inability to extend further. Able to fully flex knee  Special tests No ligamentous laxity with Lachman's or posterior drawer testing   Sensation: Sensation intact throughout the lower extremity  Strength: 5/5 strength with resisted ankle plantarflexion and dorsiflexion   Neurological:     General: No focal deficit present.     Mental Status: He is alert.  Psychiatric:        Mood and Affect: Mood normal.        Behavior: Behavior normal.     (all labs ordered are listed, but only abnormal results are displayed) Labs Reviewed - No data to display  EKG: None  Radiology: DG Knee Complete 4 Views Right Result Date: 05/23/2024 CLINICAL DATA:  Knee stuck in flexion with pain, initial encounter EXAM: RIGHT KNEE - COMPLETE 4+ VIEW COMPARISON:  None Available. FINDINGS: No evidence of fracture, dislocation, or joint effusion. No evidence of arthropathy or other focal bone abnormality. Soft tissues are unremarkable. IMPRESSION: No acute abnormality noted. Electronically Signed   By: Oneil Devonshire M.D.   On: 05/23/2024 19:33  Procedures   Medications Ordered in the ED  ibuprofen  (ADVIL ) tablet 600 mg (600 mg Oral Given 05/23/24 1914)  cyclobenzaprine  (FLEXERIL ) tablet 7.5 mg (7.5 mg Oral Given 05/23/24 1914)                                    Medical Decision Making Amount and/or Complexity of Data Reviewed Radiology: ordered.  Risk Prescription drug management.     Differential diagnosis includes but is not limited to patellar dislocation, fracture, tendon injury, meniscus tear  ED Course:  Upon initial evaluation, patient is well appearing, no acute distress.  Keeping right knee in flexed position with some soft tissue edema overlying the upper medial aspect of right knee.  Reports pain when knee  extended out.  Only able to extend to approximately 30 degrees before he reports pain.  Denies any paresthesias in the right lower extremity.  He is neurovascular intact in the right lower extremity.  No bony tenderness to palpation over the femur, tibia, fibula, or patella.  Patella does not appear to be dislocated.  Imaging Studies ordered: I ordered imaging studies including x-ray right knee I independently visualized the imaging with scope of interpretation limited to determining acute life threatening conditions related to emergency care. Imaging showed  No acute abnormality I agree with the radiologist interpretation   Medications Given: Ibuprofen , flexeril    Upon re-evaluation, patient still reports pain in the right knee.  Has not been able to straighten out since receiving Flexeril  and ibuprofen .  No evidence of patellar or other dislocation.  No evidence of fracture.  He is neurovascular intact in the right lower extremity.  He does report a history of problems in the right knee where the knee will lock up on him but usually he is able to pop it back into place.  Reports this occurs with an audible pop.  Given these symptoms, suspect he may have a chronic meniscal tear that is catching.  Low concern for for emergent etiology at this time that would require further imaging in the emergency room.  Will provide him crutches for use at home and recommended he follow-up with orthopedics within the next 1 to 2 weeks for follow-up.  Stable and appropriate for discharge home at this time    Impression: Right knee pain, possible meniscal injury  Disposition:  The patient was discharged home with instructions to take Tylenol  and ibuprofen  as needed for pain at home.  Use crutches provided to help with ambulation.  He was instructed to follow-up with Dr. Barton with orthopedics within the next 1-2 weeks for follow-up and further evaluation. Return precautions given.   This chart was  dictated using voice recognition software, Dragon. Despite the best efforts of this provider to proofread and correct errors, errors may still occur which can change documentation meaning.       Final diagnoses:  Acute pain of right knee    ED Discharge Orders     None          Veta Palma, PA-C 05/23/24 2021    Emil Share, DO 05/23/24 2156

## 2024-05-23 NOTE — Discharge Instructions (Addendum)
 Your x-ray did not show any fracture or dislocation in the knee. Based on your symptoms, you may have a meniscal injury.  Please call the orthopedic doctor (Dr. Barton) listed in your discharge paperwork for follow-up within the next 1 to 2 weeks for further evaluation and management.  You may take up to 1000mg  of tylenol  every 6 hours as needed for pain. Do not take more then 4g per day.   You may use up to 600mg  ibuprofen  every 6 hours as needed for pain.  Do not exceed 2.4g of ibuprofen  per day.  You were given your first dose of ibuprofen  here today.  Gradually return to activity as pain allows. Try to engage in non-painful types of physical activity/exercise to increase blood flow to your area of injury.  Please return to the emergency room for any numbness in your right leg/foot, pain not controlled with home medications, any other new or concerning symptoms
